# Patient Record
Sex: Male | Born: 1945
Health system: Southern US, Community
[De-identification: ages and names within clinical notes are randomized; demographics above are authoritative.]

## PROBLEM LIST (undated history)

## (undated) DIAGNOSIS — E039 Hypothyroidism, unspecified: Secondary | ICD-10-CM

## (undated) DIAGNOSIS — N400 Enlarged prostate without lower urinary tract symptoms: Secondary | ICD-10-CM

## (undated) DIAGNOSIS — R450 Nervousness: Secondary | ICD-10-CM

## (undated) DIAGNOSIS — I493 Ventricular premature depolarization: Secondary | ICD-10-CM

## (undated) DIAGNOSIS — I10 Essential (primary) hypertension: Secondary | ICD-10-CM

## (undated) DIAGNOSIS — E559 Vitamin D deficiency, unspecified: Secondary | ICD-10-CM

## (undated) DIAGNOSIS — R002 Palpitations: Secondary | ICD-10-CM

## (undated) DIAGNOSIS — E291 Testicular hypofunction: Secondary | ICD-10-CM

## (undated) DIAGNOSIS — M5093 Cervical disc disorder, unspecified, cervicothoracic region: Secondary | ICD-10-CM

## (undated) DIAGNOSIS — G4700 Insomnia, unspecified: Secondary | ICD-10-CM

## (undated) DIAGNOSIS — R0789 Other chest pain: Secondary | ICD-10-CM

## (undated) DIAGNOSIS — U071 COVID-19: Secondary | ICD-10-CM

## (undated) DIAGNOSIS — E785 Hyperlipidemia, unspecified: Secondary | ICD-10-CM

## (undated) DIAGNOSIS — M810 Age-related osteoporosis without current pathological fracture: Secondary | ICD-10-CM

## (undated) DIAGNOSIS — R42 Dizziness and giddiness: Secondary | ICD-10-CM

## (undated) DIAGNOSIS — E7800 Pure hypercholesterolemia, unspecified: Secondary | ICD-10-CM

## (undated) DIAGNOSIS — I2699 Other pulmonary embolism without acute cor pulmonale: Secondary | ICD-10-CM

## (undated) DIAGNOSIS — R9431 Abnormal electrocardiogram [ECG] [EKG]: Secondary | ICD-10-CM

## (undated) DIAGNOSIS — F32A Depression, unspecified: Secondary | ICD-10-CM

## (undated) DIAGNOSIS — Z6827 Body mass index (BMI) 27.0-27.9, adult: Secondary | ICD-10-CM

## (undated) HISTORY — DX: Testicular hypofunction: E29.1

## (undated) HISTORY — DX: Cervical disc disorder, unspecified, cervicothoracic region: M50.93

## (undated) HISTORY — DX: Vitamin D deficiency, unspecified: E55.9

## (undated) HISTORY — DX: Ventricular premature depolarization: I49.3

## (undated) HISTORY — PX: MASTOIDECTOMY: SHX711

## (undated) HISTORY — DX: Essential (primary) hypertension: I10

## (undated) HISTORY — PX: TONSILECTOMY, ADENOIDECTOMY, BILATERAL MYRINGOTOMY AND TUBES: SHX2538

## (undated) HISTORY — DX: Dizziness and giddiness: R42

## (undated) HISTORY — DX: COVID-19: U07.1

## (undated) HISTORY — DX: Insomnia, unspecified: G47.00

## (undated) HISTORY — DX: Hyperlipidemia, unspecified: E78.5

## (undated) HISTORY — DX: Age-related osteoporosis without current pathological fracture: M81.0

## (undated) HISTORY — DX: Palpitations: R00.2

## (undated) HISTORY — DX: Other chest pain: R07.89

## (undated) HISTORY — DX: Nervousness: R45.0

## (undated) HISTORY — DX: Body mass index (BMI) 27.0-27.9, adult: Z68.27

## (undated) HISTORY — DX: Depression, unspecified: F32.A

## (undated) HISTORY — DX: Abnormal electrocardiogram (ECG) (EKG): R94.31

## (undated) HISTORY — DX: Other pulmonary embolism without acute cor pulmonale: I26.99

## (undated) HISTORY — PX: NASAL SEPTUM SURGERY: SHX37

---

## 2015-09-27 DIAGNOSIS — R5383 Other fatigue: Secondary | ICD-10-CM | POA: Diagnosis not present

## 2015-09-27 DIAGNOSIS — Z125 Encounter for screening for malignant neoplasm of prostate: Secondary | ICD-10-CM | POA: Diagnosis not present

## 2015-09-27 DIAGNOSIS — M949 Disorder of cartilage, unspecified: Secondary | ICD-10-CM | POA: Diagnosis not present

## 2015-10-04 ENCOUNTER — Other Ambulatory Visit (HOSPITAL_COMMUNITY): Payer: Self-pay | Admitting: Family Medicine

## 2015-10-04 DIAGNOSIS — C61 Malignant neoplasm of prostate: Secondary | ICD-10-CM

## 2015-10-20 ENCOUNTER — Ambulatory Visit (HOSPITAL_COMMUNITY): Payer: Self-pay

## 2015-10-24 DIAGNOSIS — E291 Testicular hypofunction: Secondary | ICD-10-CM | POA: Diagnosis not present

## 2015-10-24 DIAGNOSIS — Z125 Encounter for screening for malignant neoplasm of prostate: Secondary | ICD-10-CM | POA: Diagnosis not present

## 2015-10-24 DIAGNOSIS — R5381 Other malaise: Secondary | ICD-10-CM | POA: Diagnosis not present

## 2015-11-01 ENCOUNTER — Ambulatory Visit (HOSPITAL_COMMUNITY)
Admission: RE | Admit: 2015-11-01 | Discharge: 2015-11-01 | Disposition: A | Discharge status: Home / Self Care | Payer: PPO | Source: Ambulatory Visit | Attending: Family Medicine | Admitting: Family Medicine

## 2015-11-01 DIAGNOSIS — R972 Elevated prostate specific antigen [PSA]: Secondary | ICD-10-CM | POA: Diagnosis not present

## 2015-11-01 DIAGNOSIS — C61 Malignant neoplasm of prostate: Secondary | ICD-10-CM | POA: Diagnosis not present

## 2015-11-01 DIAGNOSIS — R590 Localized enlarged lymph nodes: Secondary | ICD-10-CM | POA: Diagnosis not present

## 2015-11-01 MED ORDER — GADOBENATE DIMEGLUMINE 529 MG/ML IV SOLN
17.0000 mL | Freq: Once | INTRAVENOUS | Status: AC | PRN
  Administered 2015-11-01: 17 mL via INTRAVENOUS

## 2016-05-09 DIAGNOSIS — H2513 Age-related nuclear cataract, bilateral: Secondary | ICD-10-CM | POA: Diagnosis not present

## 2016-05-09 DIAGNOSIS — H04123 Dry eye syndrome of bilateral lacrimal glands: Secondary | ICD-10-CM | POA: Diagnosis not present

## 2016-07-29 DIAGNOSIS — Z125 Encounter for screening for malignant neoplasm of prostate: Secondary | ICD-10-CM | POA: Diagnosis not present

## 2016-07-29 DIAGNOSIS — R5381 Other malaise: Secondary | ICD-10-CM | POA: Diagnosis not present

## 2016-09-16 DIAGNOSIS — H7312 Chronic myringitis, left ear: Secondary | ICD-10-CM | POA: Diagnosis not present

## 2016-09-16 DIAGNOSIS — H6982 Other specified disorders of Eustachian tube, left ear: Secondary | ICD-10-CM | POA: Diagnosis not present

## 2016-09-16 DIAGNOSIS — H903 Sensorineural hearing loss, bilateral: Secondary | ICD-10-CM | POA: Diagnosis not present

## 2016-10-02 DIAGNOSIS — H25813 Combined forms of age-related cataract, bilateral: Secondary | ICD-10-CM | POA: Diagnosis not present

## 2016-10-09 DIAGNOSIS — Z125 Encounter for screening for malignant neoplasm of prostate: Secondary | ICD-10-CM | POA: Diagnosis not present

## 2016-10-09 DIAGNOSIS — E559 Vitamin D deficiency, unspecified: Secondary | ICD-10-CM | POA: Diagnosis not present

## 2016-10-09 DIAGNOSIS — R5381 Other malaise: Secondary | ICD-10-CM | POA: Diagnosis not present

## 2016-10-10 DIAGNOSIS — H25811 Combined forms of age-related cataract, right eye: Secondary | ICD-10-CM | POA: Diagnosis not present

## 2016-10-14 DIAGNOSIS — H2511 Age-related nuclear cataract, right eye: Secondary | ICD-10-CM | POA: Diagnosis not present

## 2016-10-14 DIAGNOSIS — H25811 Combined forms of age-related cataract, right eye: Secondary | ICD-10-CM | POA: Diagnosis not present

## 2016-10-21 DIAGNOSIS — H25812 Combined forms of age-related cataract, left eye: Secondary | ICD-10-CM | POA: Diagnosis not present

## 2016-10-21 DIAGNOSIS — H2512 Age-related nuclear cataract, left eye: Secondary | ICD-10-CM | POA: Diagnosis not present

## 2016-11-01 DIAGNOSIS — R5381 Other malaise: Secondary | ICD-10-CM | POA: Diagnosis not present

## 2016-11-01 DIAGNOSIS — R899 Unspecified abnormal finding in specimens from other organs, systems and tissues: Secondary | ICD-10-CM | POA: Diagnosis not present

## 2016-11-01 DIAGNOSIS — Z125 Encounter for screening for malignant neoplasm of prostate: Secondary | ICD-10-CM | POA: Diagnosis not present

## 2017-03-19 DIAGNOSIS — R5381 Other malaise: Secondary | ICD-10-CM | POA: Diagnosis not present

## 2017-03-19 DIAGNOSIS — Z125 Encounter for screening for malignant neoplasm of prostate: Secondary | ICD-10-CM | POA: Diagnosis not present

## 2017-05-09 DIAGNOSIS — W19XXXA Unspecified fall, initial encounter: Secondary | ICD-10-CM | POA: Diagnosis not present

## 2017-05-09 DIAGNOSIS — R279 Unspecified lack of coordination: Secondary | ICD-10-CM | POA: Diagnosis not present

## 2017-05-09 DIAGNOSIS — R42 Dizziness and giddiness: Secondary | ICD-10-CM | POA: Diagnosis not present

## 2017-05-09 DIAGNOSIS — R5381 Other malaise: Secondary | ICD-10-CM | POA: Diagnosis not present

## 2017-05-09 DIAGNOSIS — I10 Essential (primary) hypertension: Secondary | ICD-10-CM | POA: Diagnosis not present

## 2017-05-13 DIAGNOSIS — H26493 Other secondary cataract, bilateral: Secondary | ICD-10-CM | POA: Diagnosis not present

## 2017-06-05 DIAGNOSIS — H6982 Other specified disorders of Eustachian tube, left ear: Secondary | ICD-10-CM | POA: Diagnosis not present

## 2017-06-05 DIAGNOSIS — H903 Sensorineural hearing loss, bilateral: Secondary | ICD-10-CM | POA: Diagnosis not present

## 2017-08-11 DIAGNOSIS — Z961 Presence of intraocular lens: Secondary | ICD-10-CM | POA: Diagnosis not present

## 2017-11-06 DIAGNOSIS — Z125 Encounter for screening for malignant neoplasm of prostate: Secondary | ICD-10-CM | POA: Diagnosis not present

## 2017-11-06 DIAGNOSIS — R5381 Other malaise: Secondary | ICD-10-CM | POA: Diagnosis not present

## 2017-11-06 DIAGNOSIS — E785 Hyperlipidemia, unspecified: Secondary | ICD-10-CM | POA: Diagnosis not present

## 2017-11-06 DIAGNOSIS — E559 Vitamin D deficiency, unspecified: Secondary | ICD-10-CM | POA: Diagnosis not present

## 2017-12-24 DIAGNOSIS — H903 Sensorineural hearing loss, bilateral: Secondary | ICD-10-CM | POA: Diagnosis not present

## 2017-12-24 DIAGNOSIS — H6982 Other specified disorders of Eustachian tube, left ear: Secondary | ICD-10-CM | POA: Diagnosis not present

## 2018-01-02 DIAGNOSIS — Z23 Encounter for immunization: Secondary | ICD-10-CM | POA: Diagnosis not present

## 2018-03-05 DIAGNOSIS — H43391 Other vitreous opacities, right eye: Secondary | ICD-10-CM | POA: Diagnosis not present

## 2018-03-05 DIAGNOSIS — H43811 Vitreous degeneration, right eye: Secondary | ICD-10-CM | POA: Diagnosis not present

## 2018-03-23 DIAGNOSIS — Z125 Encounter for screening for malignant neoplasm of prostate: Secondary | ICD-10-CM | POA: Diagnosis not present

## 2018-03-23 DIAGNOSIS — R5381 Other malaise: Secondary | ICD-10-CM | POA: Diagnosis not present

## 2018-03-23 DIAGNOSIS — M109 Gout, unspecified: Secondary | ICD-10-CM | POA: Diagnosis not present

## 2018-09-15 DIAGNOSIS — R5381 Other malaise: Secondary | ICD-10-CM | POA: Diagnosis not present

## 2018-09-15 DIAGNOSIS — E559 Vitamin D deficiency, unspecified: Secondary | ICD-10-CM | POA: Diagnosis not present

## 2018-09-15 DIAGNOSIS — Z125 Encounter for screening for malignant neoplasm of prostate: Secondary | ICD-10-CM | POA: Diagnosis not present

## 2018-11-25 DIAGNOSIS — Z125 Encounter for screening for malignant neoplasm of prostate: Secondary | ICD-10-CM | POA: Diagnosis not present

## 2018-11-25 DIAGNOSIS — R972 Elevated prostate specific antigen [PSA]: Secondary | ICD-10-CM | POA: Diagnosis not present

## 2018-11-25 DIAGNOSIS — R5381 Other malaise: Secondary | ICD-10-CM | POA: Diagnosis not present

## 2018-12-17 DIAGNOSIS — R972 Elevated prostate specific antigen [PSA]: Secondary | ICD-10-CM | POA: Diagnosis not present

## 2019-03-02 DIAGNOSIS — Z8669 Personal history of other diseases of the nervous system and sense organs: Secondary | ICD-10-CM | POA: Diagnosis not present

## 2019-03-02 DIAGNOSIS — Z9622 Myringotomy tube(s) status: Secondary | ICD-10-CM | POA: Diagnosis not present

## 2019-03-02 DIAGNOSIS — Z9889 Other specified postprocedural states: Secondary | ICD-10-CM | POA: Diagnosis not present

## 2019-03-02 DIAGNOSIS — H9192 Unspecified hearing loss, left ear: Secondary | ICD-10-CM | POA: Diagnosis not present

## 2019-03-10 DIAGNOSIS — I10 Essential (primary) hypertension: Secondary | ICD-10-CM | POA: Diagnosis not present

## 2019-03-10 DIAGNOSIS — R079 Chest pain, unspecified: Secondary | ICD-10-CM | POA: Diagnosis not present

## 2019-03-10 DIAGNOSIS — R5381 Other malaise: Secondary | ICD-10-CM | POA: Diagnosis not present

## 2019-03-10 DIAGNOSIS — Z1339 Encounter for screening examination for other mental health and behavioral disorders: Secondary | ICD-10-CM | POA: Diagnosis not present

## 2019-03-10 DIAGNOSIS — E785 Hyperlipidemia, unspecified: Secondary | ICD-10-CM | POA: Diagnosis not present

## 2019-03-10 DIAGNOSIS — Z008 Encounter for other general examination: Secondary | ICD-10-CM | POA: Diagnosis not present

## 2019-03-10 DIAGNOSIS — Z125 Encounter for screening for malignant neoplasm of prostate: Secondary | ICD-10-CM | POA: Diagnosis not present

## 2019-03-10 DIAGNOSIS — Z1331 Encounter for screening for depression: Secondary | ICD-10-CM | POA: Diagnosis not present

## 2019-03-10 DIAGNOSIS — E559 Vitamin D deficiency, unspecified: Secondary | ICD-10-CM | POA: Diagnosis not present

## 2019-06-07 DIAGNOSIS — E785 Hyperlipidemia, unspecified: Secondary | ICD-10-CM | POA: Diagnosis not present

## 2019-06-07 DIAGNOSIS — R5381 Other malaise: Secondary | ICD-10-CM | POA: Diagnosis not present

## 2019-06-07 DIAGNOSIS — I10 Essential (primary) hypertension: Secondary | ICD-10-CM | POA: Diagnosis not present

## 2019-08-06 DIAGNOSIS — J029 Acute pharyngitis, unspecified: Secondary | ICD-10-CM | POA: Diagnosis not present

## 2019-08-06 DIAGNOSIS — R519 Headache, unspecified: Secondary | ICD-10-CM | POA: Diagnosis not present

## 2019-08-06 DIAGNOSIS — Z20828 Contact with and (suspected) exposure to other viral communicable diseases: Secondary | ICD-10-CM | POA: Diagnosis not present

## 2019-08-11 DIAGNOSIS — N390 Urinary tract infection, site not specified: Secondary | ICD-10-CM | POA: Diagnosis not present

## 2019-08-11 DIAGNOSIS — R972 Elevated prostate specific antigen [PSA]: Secondary | ICD-10-CM | POA: Diagnosis not present

## 2019-08-11 DIAGNOSIS — E559 Vitamin D deficiency, unspecified: Secondary | ICD-10-CM | POA: Diagnosis not present

## 2019-08-11 DIAGNOSIS — R5381 Other malaise: Secondary | ICD-10-CM | POA: Diagnosis not present

## 2019-08-11 DIAGNOSIS — M2540 Effusion, unspecified joint: Secondary | ICD-10-CM | POA: Diagnosis not present

## 2019-08-11 DIAGNOSIS — E291 Testicular hypofunction: Secondary | ICD-10-CM | POA: Diagnosis not present

## 2019-08-11 DIAGNOSIS — M2550 Pain in unspecified joint: Secondary | ICD-10-CM | POA: Diagnosis not present

## 2019-08-11 DIAGNOSIS — Z20828 Contact with and (suspected) exposure to other viral communicable diseases: Secondary | ICD-10-CM | POA: Diagnosis not present

## 2019-08-11 DIAGNOSIS — M109 Gout, unspecified: Secondary | ICD-10-CM | POA: Diagnosis not present

## 2019-08-12 DIAGNOSIS — M2550 Pain in unspecified joint: Secondary | ICD-10-CM | POA: Diagnosis not present

## 2019-08-12 DIAGNOSIS — M2540 Effusion, unspecified joint: Secondary | ICD-10-CM | POA: Diagnosis not present

## 2019-08-12 DIAGNOSIS — R5381 Other malaise: Secondary | ICD-10-CM | POA: Diagnosis not present

## 2019-08-12 DIAGNOSIS — Z20828 Contact with and (suspected) exposure to other viral communicable diseases: Secondary | ICD-10-CM | POA: Diagnosis not present

## 2019-08-13 DIAGNOSIS — R509 Fever, unspecified: Secondary | ICD-10-CM | POA: Diagnosis not present

## 2019-08-15 ENCOUNTER — Emergency Department (HOSPITAL_COMMUNITY)
Admission: EM | Admit: 2019-08-15 | Discharge: 2019-08-15 | Disposition: A | Discharge status: Home / Self Care | Payer: PPO | Source: Home / Self Care | Attending: Emergency Medicine | Admitting: Emergency Medicine

## 2019-08-15 ENCOUNTER — Emergency Department (HOSPITAL_COMMUNITY): Payer: PPO

## 2019-08-15 ENCOUNTER — Encounter (HOSPITAL_COMMUNITY): Payer: Self-pay

## 2019-08-15 ENCOUNTER — Other Ambulatory Visit: Payer: Self-pay

## 2019-08-15 DIAGNOSIS — E039 Hypothyroidism, unspecified: Secondary | ICD-10-CM | POA: Insufficient documentation

## 2019-08-15 DIAGNOSIS — I10 Essential (primary) hypertension: Secondary | ICD-10-CM | POA: Insufficient documentation

## 2019-08-15 DIAGNOSIS — R05 Cough: Secondary | ICD-10-CM | POA: Insufficient documentation

## 2019-08-15 DIAGNOSIS — U071 COVID-19: Secondary | ICD-10-CM | POA: Insufficient documentation

## 2019-08-15 DIAGNOSIS — R079 Chest pain, unspecified: Secondary | ICD-10-CM | POA: Diagnosis not present

## 2019-08-15 DIAGNOSIS — E876 Hypokalemia: Secondary | ICD-10-CM

## 2019-08-15 HISTORY — DX: Pure hypercholesterolemia, unspecified: E78.00

## 2019-08-15 HISTORY — DX: Hypothyroidism, unspecified: E03.9

## 2019-08-15 HISTORY — DX: Essential (primary) hypertension: I10

## 2019-08-15 LAB — CBC WITH DIFFERENTIAL/PLATELET
Abs Immature Granulocytes: 0.05 10*3/uL (ref 0.00–0.07)
Basophils Absolute: 0 10*3/uL (ref 0.0–0.1)
Basophils Relative: 0 %
Eosinophils Absolute: 0 10*3/uL (ref 0.0–0.5)
Eosinophils Relative: 0 %
HCT: 36.5 % — ABNORMAL LOW (ref 39.0–52.0)
Hemoglobin: 12.3 g/dL — ABNORMAL LOW (ref 13.0–17.0)
Immature Granulocytes: 1 %
Lymphocytes Relative: 7 %
Lymphs Abs: 0.7 10*3/uL (ref 0.7–4.0)
MCH: 30.1 pg (ref 26.0–34.0)
MCHC: 33.7 g/dL (ref 30.0–36.0)
MCV: 89.5 fL (ref 80.0–100.0)
Monocytes Absolute: 0.4 10*3/uL (ref 0.1–1.0)
Monocytes Relative: 4 %
Neutro Abs: 8.7 10*3/uL — ABNORMAL HIGH (ref 1.7–7.7)
Neutrophils Relative %: 88 %
Platelets: 256 10*3/uL (ref 150–400)
RBC: 4.08 MIL/uL — ABNORMAL LOW (ref 4.22–5.81)
RDW: 14.2 % (ref 11.5–15.5)
WBC: 9.9 10*3/uL (ref 4.0–10.5)
nRBC: 0 % (ref 0.0–0.2)

## 2019-08-15 LAB — COMPREHENSIVE METABOLIC PANEL
ALT: 43 U/L (ref 0–44)
AST: 38 U/L (ref 15–41)
Albumin: 3 g/dL — ABNORMAL LOW (ref 3.5–5.0)
Alkaline Phosphatase: 45 U/L (ref 38–126)
Anion gap: 11 (ref 5–15)
BUN: 20 mg/dL (ref 8–23)
CO2: 21 mmol/L — ABNORMAL LOW (ref 22–32)
Calcium: 8 mg/dL — ABNORMAL LOW (ref 8.9–10.3)
Chloride: 105 mmol/L (ref 98–111)
Creatinine, Ser: 0.8 mg/dL (ref 0.61–1.24)
GFR calc Af Amer: >60 mL/min (ref >60)
GFR calc non Af Amer: >60 mL/min (ref >60)
Glucose, Bld: 101 mg/dL — ABNORMAL HIGH (ref 70–99)
Potassium: 3.3 mmol/L — ABNORMAL LOW (ref 3.5–5.1)
Sodium: 137 mmol/L (ref 135–145)
Total Bilirubin: 0.2 mg/dL — ABNORMAL LOW (ref 0.3–1.2)
Total Protein: 6.5 g/dL (ref 6.5–8.1)

## 2019-08-15 LAB — LACTIC ACID, PLASMA: Lactic Acid, Venous: 1.6 mmol/L (ref 0.5–1.9)

## 2019-08-15 MED ORDER — ACETAMINOPHEN 325 MG PO TABS
325.0000 mg | ORAL_TABLET | Freq: Once | ORAL | Status: AC
  Administered 2019-08-15: 325 mg via ORAL
  Filled 2019-08-15: qty 1

## 2019-08-15 MED ORDER — IBUPROFEN 400 MG PO TABS: 400.0000 mg | ORAL_TABLET | Freq: Once | ORAL | Status: DC

## 2019-08-15 MED ORDER — POTASSIUM CHLORIDE CRYS ER 20 MEQ PO TBCR
40.0000 meq | EXTENDED_RELEASE_TABLET | Freq: Once | ORAL | Status: AC
  Administered 2019-08-15: 40 meq via ORAL
  Filled 2019-08-15: qty 2

## 2019-08-15 MED ORDER — GUAIFENESIN-CODEINE 100-10 MG/5ML PO SOLN
5.0000 mL | Freq: Once | ORAL | Status: AC
  Administered 2019-08-15: 5 mL via ORAL
  Filled 2019-08-15: qty 5

## 2019-08-15 MED ORDER — SODIUM CHLORIDE 0.9 % IV BOLUS
1000.0000 mL | Freq: Once | INTRAVENOUS | Status: AC
  Administered 2019-08-15: 1000 mL via INTRAVENOUS

## 2019-08-15 MED ORDER — POTASSIUM CHLORIDE CRYS ER 20 MEQ PO TBCR: 20.0000 meq | EXTENDED_RELEASE_TABLET | Freq: Every day | ORAL | 0 refills | Status: DC

## 2019-08-15 MED ORDER — IOHEXOL 350 MG/ML SOLN
100.0000 mL | Freq: Once | INTRAVENOUS | Status: AC | PRN
  Administered 2019-08-15: 100 mL via INTRAVENOUS

## 2019-08-15 NOTE — Discharge Instructions (Signed)
You were seen in the emergency department today for symptoms related to COVID-19. Your work-up in the emergency department was overall reassuring. Your labs show that your calcium and potassium are each mildly low as listed below. We have provided diet recommendations. We are sending you home with a short course of a potassium supplement to help with this. Your hemoglobin seems to have been fairly similar to prior, slightly lower. Please have each of these labs rechecked within 1 week.  Your CT scan showed findings consistent with COVID-19 as detailed below. There was no blood clot.  Please continue to take your doxycycline and use your inhaler as well as cough medicines as needed.  Please Follow-up with your primary care provider for evaluation of your symptoms within 3 days. Please continue to quarantine per CDC guidelines. Return to the emergency department for any new or worsening symptoms including but not limited to shortness of breath, chest pain, dizziness, passing out, coughing up blood, or any other concerns.   Results for orders placed or performed during the hospital encounter of 08/15/19  Comprehensive metabolic panel  Result Value Ref Range   Sodium 137 135 - 145 mmol/L   Potassium 3.3 (L) 3.5 - 5.1 mmol/L   Chloride 105 98 - 111 mmol/L   CO2 21 (L) 22 - 32 mmol/L   Glucose, Bld 101 (H) 70 - 99 mg/dL   BUN 20 8 - 23 mg/dL   Creatinine, Ser 0.80 0.61 - 1.24 mg/dL   Calcium 8.0 (L) 8.9 - 10.3 mg/dL   Total Protein 6.5 6.5 - 8.1 g/dL   Albumin 3.0 (L) 3.5 - 5.0 g/dL   AST 38 15 - 41 U/L   ALT 43 0 - 44 U/L   Alkaline Phosphatase 45 38 - 126 U/L   Total Bilirubin 0.2 (L) 0.3 - 1.2 mg/dL   GFR calc non Af Amer >60 >60 mL/min   GFR calc Af Amer >60 >60 mL/min   Anion gap 11 5 - 15  CBC with Differential  Result Value Ref Range   WBC 9.9 4.0 - 10.5 K/uL   RBC 4.08 (L) 4.22 - 5.81 MIL/uL   Hemoglobin 12.3 (L) 13.0 - 17.0 g/dL   HCT 36.5 (L) 39.0 - 52.0 %   MCV 89.5 80.0 -  100.0 fL   MCH 30.1 26.0 - 34.0 pg   MCHC 33.7 30.0 - 36.0 g/dL   RDW 14.2 11.5 - 15.5 %   Platelets 256 150 - 400 K/uL   nRBC 0.0 0.0 - 0.2 %   Neutrophils Relative % 88 %   Neutro Abs 8.7 (H) 1.7 - 7.7 K/uL   Lymphocytes Relative 7 %   Lymphs Abs 0.7 0.7 - 4.0 K/uL   Monocytes Relative 4 %   Monocytes Absolute 0.4 0.1 - 1.0 K/uL   Eosinophils Relative 0 %   Eosinophils Absolute 0.0 0.0 - 0.5 K/uL   Basophils Relative 0 %   Basophils Absolute 0.0 0.0 - 0.1 K/uL   Immature Granulocytes 1 %   Abs Immature Granulocytes 0.05 0.00 - 0.07 K/uL  Lactic acid, plasma  Result Value Ref Range   Lactic Acid, Venous 1.6 0.5 - 1.9 mmol/L   Ct Angio Chest Pe W/cm &/or Wo Cm  Result Date: 08/15/2019 CLINICAL DATA:  Intermittent fever, dry cough for 2 weeks, tested COVID-19 positive this past Wednesday, new onset cough, history hypertension EXAM: CT ANGIOGRAPHY CHEST WITH CONTRAST TECHNIQUE: Multidetector CT imaging of the chest was performed using the  standard protocol during bolus administration of intravenous contrast. Multiplanar CT image reconstructions and MIPs were obtained to evaluate the vascular anatomy. CONTRAST:  139mL OMNIPAQUE IOHEXOL 350 MG/ML SOLN IV COMPARISON:  None FINDINGS: Cardiovascular: Atherosclerotic calcifications aorta, coronary arteries and proximal great vessels. Aorta normal caliber. Upper normal size of cardiac chambers. No pericardial effusion. Pulmonary arteries well opacified and patent. No evidence of pulmonary embolism. Mediastinum/Nodes: Base of cervical region normal appearance. No thoracic adenopathy. Esophagus unremarkable. Lungs/Pleura: Extensive BILATERAL patchy ground-glass and airspace infiltrates consistent with multifocal pneumonia and history of COVID-19. No pleural effusion or pneumothorax. No discrete mass/nodule identified. Upper Abdomen: Visualized upper abdomen normal appearance Musculoskeletal: Osseous structures unremarkable. Review of the MIP images  confirms the above findings. IMPRESSION: No evidence of pulmonary embolism. Extensive BILATERAL patchy ground-glass and airspace infiltrates consistent with multifocal pneumonia due to COVID-19. Aortic Atherosclerosis (ICD10-I70.0). Electronically Signed   By: Lavonia Dana M.D.   On: 08/15/2019 21:11       Person Under Monitoring Name: MCCALL BEAUCHESNE  Location: [place] [address] [id number]   Infection Prevention Recommendations for Individuals Confirmed to have, or Being Evaluated for, 2019 Novel Coronavirus (COVID-19) Infection Who Receive Care at Home  Individuals who are confirmed to have, or are being evaluated for, COVID-19 should follow the prevention steps below until a healthcare provider or local or state health department says they can return to normal activities.  Stay home except to get medical care You should restrict activities outside your home, except for getting medical care. Do not go to work, school, or public areas, and do not use public transportation or taxis.  Call ahead before visiting your doctor Before your medical appointment, call the healthcare provider and tell them that you have, or are being evaluated for, COVID-19 infection. This will help the healthcare providers office take steps to keep other people from getting infected. Ask your healthcare provider to call the local or state health department.  Monitor your symptoms Seek prompt medical attention if your illness is worsening (e.g., difficulty breathing). Before going to your medical appointment, call the healthcare provider and tell them that you have, or are being evaluated for, COVID-19 infection. Ask your healthcare provider to call the local or state health department.  Wear a facemask You should wear a facemask that covers your nose and mouth when you are in the same room with other people and when you visit a healthcare provider. People who live with or visit you should also  wear a facemask while they are in the same room with you.  Separate yourself from other people in your home As much as possible, you should stay in a different room from other people in your home. Also, you should use a separate bathroom, if available.  Avoid sharing household items You should not share dishes, drinking glasses, cups, eating utensils, towels, bedding, or other items with other people in your home. After using these items, you should wash them thoroughly with soap and water.  Cover your coughs and sneezes Cover your mouth and nose with a tissue when you cough or sneeze, or you can cough or sneeze into your sleeve. Throw used tissues in a lined trash can, and immediately wash your hands with soap and water for at least 20 seconds or use an alcohol-based hand rub.  Wash your [facility] your hands often and thoroughly with soap and water for at least 20 seconds. You can use an alcohol-based hand sanitizer if soap  and water are not available and if your hands are not visibly dirty. Avoid touching your eyes, nose, and mouth with unwashed hands.   Prevention Steps for Caregivers and Household Members of Individuals Confirmed to have, or Being Evaluated for, COVID-19 Infection Being Cared for in the Home  If you live with, or provide care at home for, a person confirmed to have, or being evaluated for, COVID-19 infection please follow these guidelines to prevent infection:  Follow healthcare providers instructions Make sure that you understand and can help the patient follow any healthcare provider instructions for all care.  Provide for the patients basic needs You should help the patient with basic needs in the home and provide support for getting groceries, prescriptions, and other personal needs.  Monitor the patients symptoms If they are getting sicker, call his or her medical provider and tell them that the patient has, or is being evaluated for, COVID-19  infection. This will help the healthcare providers office take steps to keep other people from getting infected. Ask the healthcare provider to call the local or state health department.  Limit the number of people who have contact with the patient If possible, have only one caregiver for the patient. Other household members should stay in another home or place of residence. If this is not possible, they should stay in another room, or be separated from the patient as much as possible. Use a separate bathroom, if available. Restrict visitors who do not have an essential need to be in the home.  Keep older adults, very young children, and other sick people away from the patient Keep older adults, very young children, and those who have compromised immune systems or chronic health conditions away from the patient. This includes people with chronic heart, lung, or kidney conditions, diabetes, and cancer.  Ensure good ventilation Make sure that shared spaces in the home have good air flow, such as from an air conditioner or an opened window, weather permitting.  Wash your hands often Wash your hands often and thoroughly with soap and water for at least 20 seconds. You can use an alcohol based hand sanitizer if soap and water are not available and if your hands are not visibly dirty. Avoid touching your eyes, nose, and mouth with unwashed hands. Use disposable paper towels to dry your hands. If not available, use dedicated cloth towels and replace them when they become wet.  Wear a facemask and gloves Wear a disposable facemask at all times in the room and gloves when you touch or have contact with the patients blood, body fluids, and/or secretions or excretions, such as sweat, saliva, sputum, nasal mucus, vomit, urine, or feces.  Ensure the mask fits over your nose and mouth tightly, and do not touch it during use. Throw out disposable facemasks and gloves after using them. Do not reuse. Wash  your hands immediately after removing your facemask and gloves. If your personal clothing becomes contaminated, carefully remove clothing and launder. Wash your hands after handling contaminated clothing. Place all used disposable facemasks, gloves, and other waste in a lined container before disposing them with other household waste. Remove gloves and wash your hands immediately after handling these items.  Do not share dishes, glasses, or other household items with the patient Avoid sharing household items. You should not share dishes, drinking glasses, cups, eating utensils, towels, bedding, or other items with a patient who is confirmed to have, or being evaluated for, COVID-19 infection. After the person uses  these items, you should wash them thoroughly with soap and water.  Wash laundry thoroughly Immediately remove and wash clothes or bedding that have blood, body fluids, and/or secretions or excretions, such as sweat, saliva, sputum, nasal mucus, vomit, urine, or feces, on them. Wear gloves when handling laundry from the patient. Read and follow directions on labels of laundry or clothing items and detergent. In general, wash and dry with the warmest temperatures recommended on the label.  Clean all areas the individual has used often Clean all touchable surfaces, such as counters, tabletops, doorknobs, bathroom fixtures, toilets, phones, keyboards, tablets, and bedside tables, every day. Also, clean any surfaces that may have blood, body fluids, and/or secretions or excretions on them. Wear gloves when cleaning surfaces the patient has come in contact with. Use a diluted bleach solution (e.g., dilute bleach with 1 part bleach and 10 parts water) or a household disinfectant with a label that says EPA-registered for coronaviruses. To make a bleach solution at home, add 1 tablespoon of bleach to 1 quart (4 cups) of water. For a larger supply, add  cup of bleach to 1 gallon (16 cups) of  water. Read labels of cleaning products and follow recommendations provided on product labels. Labels contain instructions for safe and effective use of the cleaning product including precautions you should take when applying the product, such as wearing gloves or eye protection and making sure you have good ventilation during use of the product. Remove gloves and wash hands immediately after cleaning.  Monitor yourself for signs and symptoms of illness Caregivers and household members are considered close contacts, should monitor their health, and will be asked to limit movement outside of the home to the extent possible. Follow the monitoring steps for close contacts listed on the symptom monitoring form.   ? If you have additional questions, contact your local health department or call the epidemiologist on call at [phone] (available 24/7). ? This guidance is subject to change. For the most up-to-date guidance from [facility], please refer to their website: [email or web address]

## 2019-08-15 NOTE — ED Notes (Signed)
Patient contact number updated in the chart

## 2019-08-15 NOTE — ED Notes (Signed)
Patient transported to CT 

## 2019-08-15 NOTE — ED Provider Notes (Addendum)
[facility]PART[facility] Provider Note   CSN: [id number] Arrival date & time: 08/15/19  1531     History   Chief Complaint Chief Complaint  Patient presents with   Fever   Cough   HPI David Villegas is a 73 y.o. male with a history of hypertension, hypercholesterolemia, & hypothyroidism who presents to the ED with complaints of intermittent fever & cough since 08/05/19 with known COVID 19 positive testing performed 12/06. Patient states he has had a dry cough with intermittent fever with temp max of 102.5. He states with coughing he has some discomfort in the chest & to his incisional abdominal hernia but otherwise is not experiencing any pain. He notes his appetite decreases when fever is high but temp & appetite improve with anti-pyretics. No other alleviating/aggravating factors. COVID testing performed 12/06 returned positive on12/08- he had a CXR @ [facility] 12/08 that showed an atypical pneumonia. He just completed a course of Azithromycin yesterday which did not seem to change his symptoms much therefore he start doxycyline BID. He is also tried medicines, albuterol inhalers, and other over-the-counter medicines to help with his symptoms. 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 Denies ear pain, sore throat, congestion, dyspnea, nausea, vomiting, diarrhea, syncope, or leg pain/swelling.   HPI  Past Medical History:  Diagnosis Date   High cholesterol    Hypertension    Hypothyroidism     There are no active problems to display for this patient.   History reviewed. No pertinent surgical history.      Home Medications    Prior to Admission medications   Not on File    Family History History reviewed. No pertinent family history.  Social History Social History   Tobacco Use   Smoking status: Never Smoker     Smokeless tobacco: Never Used  Substance Use Topics   Alcohol use: Never    Frequency: Never   Drug use: Never     Allergies   Patient has no known allergies.  Review of Systems Review of Systems  Constitutional: Positive for appetite change and fever.  HENT: Negative for congestion, ear pain and sore throat.   Respiratory: Positive for cough. Negative for shortness of breath.   Cardiovascular: Positive for chest pain (w/ coughing otherwise none).  Gastrointestinal: Positive for abdominal pain (with coughing @ hernia site, otherwise none). Negative for diarrhea, nausea and vomiting.  Neurological: Negative for syncope.  All other systems reviewed and are negative.  Physical Exam Updated Vital Signs BP 93/78 (BP Location: Right Arm)    Pulse 71    Temp (!) 100.9 F (38.3 C) (Oral)    Resp 18    Ht 5\' 6"  (1.676 m)    Wt 74.8 kg    SpO2 96%    BMI 26.63 kg/m   Physical Exam Vitals signs and nursing note reviewed.  Constitutional:      General: He is not in acute distress.    Appearance: He is well-developed. He is not toxic-appearing.  HENT:     Head: Normocephalic and atraumatic.  Eyes:     General:        Right eye: No discharge.        Left eye: No discharge.     Conjunctiva/sclera: Conjunctivae normal.  Neck:     Musculoskeletal: Neck supple.  Cardiovascular:  Rate and Rhythm: Normal rate and regular rhythm.  Pulmonary:     Effort: Pulmonary effort is normal. No respiratory distress.     Breath sounds: Rhonchi (Right base.) present. No wheezing or rales.  Abdominal:     General: There is no distension.     Palpations: Abdomen is soft.     Tenderness: There is no abdominal tenderness. There is no guarding or rebound.     Hernia: A hernia (ventral defect, reducible) is present.  Musculoskeletal:     Right lower leg: No edema.     Left lower leg: No edema.  Skin:    General: Skin is warm and dry.     Findings: No rash.  Neurological:     Mental Status:  He is alert.     Comments: Clear speech.   Psychiatric:        Behavior: Behavior normal.    ED Treatments / Results  Labs (all labs ordered are listed, but only abnormal results are displayed) Labs Reviewed  COMPREHENSIVE METABOLIC PANEL - Abnormal; Notable for the following components:      Result Value   Potassium 3.3 (*)    CO2 21 (*)    Glucose, Bld 101 (*)    Calcium 8.0 (*)    Albumin 3.0 (*)    Total Bilirubin 0.2 (*)    All other components within normal limits  CBC WITH DIFFERENTIAL/PLATELET - Abnormal; Notable for the following components:   RBC 4.08 (*)    Hemoglobin 12.3 (*)    HCT 36.5 (*)    Neutro Abs 8.7 (*)    All other components within normal limits  CULTURE, BLOOD (ROUTINE X 2)  CULTURE, BLOOD (ROUTINE X 2)  LACTIC ACID, PLASMA  LACTIC ACID, PLASMA    EKG None  Radiology Ct Angio Chest Pe W/cm &/or Wo Cm  Result Date: 08/15/2019 CLINICAL DATA:  Intermittent fever, dry cough for 2 weeks, tested COVID-19 positive this past Wednesday, new onset cough, history hypertension EXAM: CT ANGIOGRAPHY CHEST WITH CONTRAST TECHNIQUE: Multidetector CT imaging of the chest was performed using the standard protocol during bolus administration of intravenous contrast. Multiplanar CT image reconstructions and MIPs were obtained to evaluate the vascular anatomy. CONTRAST:  123mL OMNIPAQUE IOHEXOL 350 MG/ML SOLN IV COMPARISON:  None FINDINGS: Cardiovascular: Atherosclerotic calcifications aorta, coronary arteries and proximal great vessels. Aorta normal caliber. Upper normal size of cardiac chambers. No pericardial effusion. Pulmonary arteries well opacified and patent. No evidence of pulmonary embolism. Mediastinum/Nodes: Base of cervical region normal appearance. No thoracic adenopathy. Esophagus unremarkable. Lungs/Pleura: Extensive BILATERAL patchy ground-glass and airspace infiltrates consistent with multifocal pneumonia and history of COVID-19. No pleural effusion or  pneumothorax. No discrete mass/nodule identified. Upper Abdomen: Visualized upper abdomen normal appearance Musculoskeletal: Osseous structures unremarkable. Review of the MIP images confirms the above findings. IMPRESSION: No evidence of pulmonary embolism. Extensive BILATERAL patchy ground-glass and airspace infiltrates consistent with multifocal pneumonia due to COVID-19. Aortic Atherosclerosis (ICD10-I70.0). Electronically Signed   By: Lavonia Dana M.D.   On: 08/15/2019 21:11    Procedures Procedures (including critical care time)  Medications Ordered in ED Medications  potassium chloride SA (KLOR-CON) CR tablet 40 mEq (has no administration in time range)  sodium chloride 0.9 % bolus 1,000 mL (0 mLs Intravenous Stopped 08/15/19 2024)  acetaminophen (TYLENOL) tablet 325 mg (325 mg Oral Given 08/15/19 1734)  iohexol (OMNIPAQUE) 350 MG/ML injection 100 mL (100 mLs Intravenous Contrast Given 08/15/19 2029)     Initial Impression /  Assessment and Plan / ED Course  I have reviewed the triage vital signs and the nursing notes.  Pertinent labs & imaging results that were available during my care of the patient were reviewed by me and considered in my medical decision making (see chart for details).   Patient with known COVID-19 positive testing presents to the emergency department for continued fevers and cough since 11/26. Patient is nontoxic-appearing, resting comfortably, vitals notable for fever with temperature of 100.9. Patient does have some rhonchi at the right base, lungs otherwise clear, no signs of respiratory distress. He does have a ventral hernia which is reducible. Plan for labs & CT chest. Will also give fluids.   CBC: Anemia which is similar to prior ranges per discussion of his prior labs with his wife via telephone. No leukocytosis or leukopenia. Platelets WNL. CMP: Mild hypokalemia and hypocalcemia, will provide K-Dur tablets and diet recommendations. Renal function preserved.  LFTs WNL. Lactic acid: WNL. EKG: No STEMI Chest CT: No pulmonary embolism, extensive bilateral patchy groundglass airspace infiltrates consistent with multifocal pneumonia due to COVID-19.  Ambulated throughout exam room maintaining SPO2 greater than 90%. NT/RN staff documented some tachypnea on vitals by automated system, on each of my assessments patient has had a normal respiratory rate- nursing staff informs me these have been during coughing spells.  He is not complaining of any significant shortness of breath. He overall appears appropriate for discharge home. Will provide potassium supplement. He is currently taking doxycycline which we will have him continue. Also already has an albuterol inhaler and antitussive medicines. He is comfortable with plan for discharge. I also spoke with his wife Dr. Toy Care via telephone. I discussed results, treatment plan, need for follow-up, and return precautions with the patient & his wife. Provided opportunity for questions, patient & his wife confirmed understanding and are in agreement with plan.   This is a shared visit with supervising physician Dr. Sedonia Small who has independently evaluated patient & provided guidance in evaluation/management/disposition, in agreement with care    Emerick S Herrman was evaluated in Emergency Department on 08/15/2019 for the symptoms described in the history of present illness. He/she was evaluated in the context of the global COVID-19 pandemic, which necessitated consideration that the patient might be at risk for infection with the SARS-CoV-2 virus that causes COVID-19. Institutional protocols and algorithms that pertain to the evaluation of patients at risk for COVID-19 are in a state of rapid change based on information released by regulatory bodies including the CDC and federal and state organizations. These policies and algorithms were followed during the patient's care in the ED.  Vitals:   08/15/19 1552 08/15/19 1814  BP:  93/78 120/71  Pulse: 71 72  Resp: 18 (!) 21  Temp: (!) 100.9 F (38.3 C) 98.1 F (36.7 C)  SpO2: 96% 95%    Final Clinical Impressions(s) / ED Diagnoses   Final diagnoses:  COVID-19  Hypokalemia  Hypocalcemia    ED Discharge Orders         Ordered    potassium chloride SA (KLOR-CON) 20 MEQ tablet  Daily     08/15/19 2206           Amaryllis Dyke, PA-C 08/15/19 2208    Amaryllis Dyke, PA-C 08/15/19 2254    Maudie Flakes, MD 08/19/19 1718

## 2019-08-15 NOTE — ED Triage Notes (Signed)
Pt presents with intermittent  fever and  Dry cough x2 weeks. Pt had a CXR at [facility] Friday afternoon. Tested for Covid Wednesday results came back Covid + on Friday. Pt here w/no new symptoms he is concerned he's not improving. Pt states he is taking OTC Tylenol, Mucinex and a Z pack w/no improvements.

## 2019-08-15 NOTE — ED Notes (Signed)
Pt ambulated inside the room. Pt stable on feet with steady gait. O2 92% on RA.

## 2019-08-17 ENCOUNTER — Telehealth: Payer: Self-pay | Admitting: Emergency Medicine

## 2019-08-17 ENCOUNTER — Other Ambulatory Visit: Payer: Self-pay

## 2019-08-17 ENCOUNTER — Emergency Department (HOSPITAL_COMMUNITY): Payer: PPO

## 2019-08-17 ENCOUNTER — Inpatient Hospital Stay (HOSPITAL_COMMUNITY)
Admission: EM | Admit: 2019-08-17 | Discharge: 2019-08-26 | DRG: 177 | Disposition: A | Discharge status: Home Health | Payer: PPO | Attending: Internal Medicine | Admitting: Internal Medicine

## 2019-08-17 ENCOUNTER — Encounter (HOSPITAL_COMMUNITY): Payer: Self-pay | Admitting: Emergency Medicine

## 2019-08-17 DIAGNOSIS — J9601 Acute respiratory failure with hypoxia: Secondary | ICD-10-CM | POA: Diagnosis not present

## 2019-08-17 DIAGNOSIS — U071 COVID-19: Secondary | ICD-10-CM | POA: Diagnosis not present

## 2019-08-17 DIAGNOSIS — R069 Unspecified abnormalities of breathing: Secondary | ICD-10-CM | POA: Diagnosis not present

## 2019-08-17 DIAGNOSIS — N400 Enlarged prostate without lower urinary tract symptoms: Secondary | ICD-10-CM | POA: Diagnosis not present

## 2019-08-17 DIAGNOSIS — Z8249 Family history of ischemic heart disease and other diseases of the circulatory system: Secondary | ICD-10-CM

## 2019-08-17 DIAGNOSIS — J1282 Pneumonia due to coronavirus disease 2019: Secondary | ICD-10-CM | POA: Diagnosis present

## 2019-08-17 DIAGNOSIS — M2550 Pain in unspecified joint: Secondary | ICD-10-CM | POA: Diagnosis not present

## 2019-08-17 DIAGNOSIS — J1289 Other viral pneumonia: Secondary | ICD-10-CM | POA: Diagnosis not present

## 2019-08-17 DIAGNOSIS — I10 Essential (primary) hypertension: Secondary | ICD-10-CM

## 2019-08-17 DIAGNOSIS — J189 Pneumonia, unspecified organism: Secondary | ICD-10-CM | POA: Diagnosis not present

## 2019-08-17 DIAGNOSIS — Z7401 Bed confinement status: Secondary | ICD-10-CM | POA: Diagnosis not present

## 2019-08-17 DIAGNOSIS — R0902 Hypoxemia: Secondary | ICD-10-CM | POA: Diagnosis not present

## 2019-08-17 DIAGNOSIS — M109 Gout, unspecified: Secondary | ICD-10-CM | POA: Diagnosis present

## 2019-08-17 DIAGNOSIS — E785 Hyperlipidemia, unspecified: Secondary | ICD-10-CM

## 2019-08-17 DIAGNOSIS — E039 Hypothyroidism, unspecified: Secondary | ICD-10-CM

## 2019-08-17 DIAGNOSIS — R0602 Shortness of breath: Secondary | ICD-10-CM | POA: Diagnosis not present

## 2019-08-17 DIAGNOSIS — E876 Hypokalemia: Secondary | ICD-10-CM | POA: Diagnosis not present

## 2019-08-17 DIAGNOSIS — R05 Cough: Secondary | ICD-10-CM | POA: Diagnosis not present

## 2019-08-17 DIAGNOSIS — E7800 Pure hypercholesterolemia, unspecified: Secondary | ICD-10-CM | POA: Diagnosis not present

## 2019-08-17 DIAGNOSIS — J440 Chronic obstructive pulmonary disease with acute lower respiratory infection: Secondary | ICD-10-CM | POA: Diagnosis present

## 2019-08-17 DIAGNOSIS — E8770 Fluid overload, unspecified: Secondary | ICD-10-CM | POA: Diagnosis not present

## 2019-08-17 DIAGNOSIS — K429 Umbilical hernia without obstruction or gangrene: Secondary | ICD-10-CM | POA: Diagnosis present

## 2019-08-17 DIAGNOSIS — R5381 Other malaise: Secondary | ICD-10-CM | POA: Diagnosis not present

## 2019-08-17 HISTORY — DX: Benign prostatic hyperplasia without lower urinary tract symptoms: N40.0

## 2019-08-17 HISTORY — DX: Hyperlipidemia, unspecified: E78.5

## 2019-08-17 HISTORY — DX: COVID-19: U07.1

## 2019-08-17 HISTORY — DX: Essential (primary) hypertension: I10

## 2019-08-17 LAB — CBC WITH DIFFERENTIAL/PLATELET
Abs Immature Granulocytes: 0.09 10*3/uL — ABNORMAL HIGH (ref 0.00–0.07)
Basophils Absolute: 0 10*3/uL (ref 0.0–0.1)
Basophils Relative: 0 %
Eosinophils Absolute: 0 10*3/uL (ref 0.0–0.5)
Eosinophils Relative: 0 %
HCT: 33.4 % — ABNORMAL LOW (ref 39.0–52.0)
Hemoglobin: 11.3 g/dL — ABNORMAL LOW (ref 13.0–17.0)
Immature Granulocytes: 1 %
Lymphocytes Relative: 5 %
Lymphs Abs: 0.6 10*3/uL — ABNORMAL LOW (ref 0.7–4.0)
MCH: 30.5 pg (ref 26.0–34.0)
MCHC: 33.8 g/dL (ref 30.0–36.0)
MCV: 90.3 fL (ref 80.0–100.0)
Monocytes Absolute: 0.2 10*3/uL (ref 0.1–1.0)
Monocytes Relative: 2 %
Neutro Abs: 11.6 10*3/uL — ABNORMAL HIGH (ref 1.7–7.7)
Neutrophils Relative %: 92 %
Platelets: 279 10*3/uL (ref 150–400)
RBC: 3.7 MIL/uL — ABNORMAL LOW (ref 4.22–5.81)
RDW: 14.6 % (ref 11.5–15.5)
WBC: 12.5 10*3/uL — ABNORMAL HIGH (ref 4.0–10.5)
nRBC: 0 % (ref 0.0–0.2)

## 2019-08-17 LAB — TROPONIN I (HIGH SENSITIVITY)
Troponin I (High Sensitivity): 13 ng/L (ref <18)
Troponin I (High Sensitivity): 17 ng/L (ref <18)

## 2019-08-17 LAB — COMPREHENSIVE METABOLIC PANEL
ALT: 30 U/L (ref 0–44)
AST: 31 U/L (ref 15–41)
Albumin: 2.5 g/dL — ABNORMAL LOW (ref 3.5–5.0)
Alkaline Phosphatase: 40 U/L (ref 38–126)
Anion gap: 9 (ref 5–15)
BUN: 18 mg/dL (ref 8–23)
CO2: 20 mmol/L — ABNORMAL LOW (ref 22–32)
Calcium: 8.1 mg/dL — ABNORMAL LOW (ref 8.9–10.3)
Chloride: 110 mmol/L (ref 98–111)
Creatinine, Ser: 0.72 mg/dL (ref 0.61–1.24)
GFR calc Af Amer: >60 mL/min (ref >60)
GFR calc non Af Amer: >60 mL/min (ref >60)
Glucose, Bld: 118 mg/dL — ABNORMAL HIGH (ref 70–99)
Potassium: 3.9 mmol/L (ref 3.5–5.1)
Sodium: 139 mmol/L (ref 135–145)
Total Bilirubin: 0.6 mg/dL (ref 0.3–1.2)
Total Protein: 6.3 g/dL — ABNORMAL LOW (ref 6.5–8.1)

## 2019-08-17 LAB — POCT I-STAT 7, (LYTES, BLD GAS, ICA,H+H)
Acid-base deficit: 4 mmol/L — ABNORMAL HIGH (ref 0.0–2.0)
Bicarbonate: 18.5 mmol/L — ABNORMAL LOW (ref 20.0–28.0)
Calcium, Ion: 1.18 mmol/L (ref 1.15–1.40)
HCT: 33 % — ABNORMAL LOW (ref 39.0–52.0)
Hemoglobin: 11.2 g/dL — ABNORMAL LOW (ref 13.0–17.0)
O2 Saturation: 85 %
Patient temperature: 37.9
Potassium: 3.6 mmol/L (ref 3.5–5.1)
Sodium: 139 mmol/L (ref 135–145)
TCO2: 19 mmol/L — ABNORMAL LOW (ref 22–32)
pCO2 arterial: 28.1 mmHg — ABNORMAL LOW (ref 32.0–48.0)
pH, Arterial: 7.432 (ref 7.350–7.450)
pO2, Arterial: 50 mmHg — ABNORMAL LOW (ref 83.0–108.0)

## 2019-08-17 LAB — LACTIC ACID, PLASMA
Lactic Acid, Venous: 2.1 mmol/L (ref 0.5–1.9)
Lactic Acid, Venous: 2.2 mmol/L (ref 0.5–1.9)

## 2019-08-17 LAB — FERRITIN: Ferritin: 251 ng/mL (ref 24–336)

## 2019-08-17 LAB — C-REACTIVE PROTEIN: CRP: 16.2 mg/dL — ABNORMAL HIGH (ref <1.0)

## 2019-08-17 LAB — PROCALCITONIN: Procalcitonin: <0.1 ng/mL

## 2019-08-17 LAB — BRAIN NATRIURETIC PEPTIDE: B Natriuretic Peptide: 128.8 pg/mL — ABNORMAL HIGH (ref 0.0–100.0)

## 2019-08-17 LAB — ABO/RH: ABO/RH(D): B POS

## 2019-08-17 LAB — D-DIMER, QUANTITATIVE: D-Dimer, Quant: 2.12 ug/mL-FEU — ABNORMAL HIGH (ref 0.00–0.50)

## 2019-08-17 MED ORDER — LEVOTHYROXINE SODIUM 75 MCG PO TABS: 150.0000 ug | ORAL_TABLET | Freq: Every day | ORAL | Status: DC

## 2019-08-17 MED ORDER — DEXAMETHASONE SODIUM PHOSPHATE 4 MG/ML IJ SOLN
2.0000 mg | Freq: Once | INTRAMUSCULAR | Status: AC
  Administered 2019-08-17: 2 mg via INTRAVENOUS
  Filled 2019-08-17: qty 1

## 2019-08-17 MED ORDER — SODIUM CHLORIDE 0.9 % IV SOLN
100.0000 mg | Freq: Every day | INTRAVENOUS | Status: AC
  Administered 2019-08-18 – 2019-08-21 (×4): 100 mg via INTRAVENOUS
  Filled 2019-08-17 (×4): qty 20

## 2019-08-17 MED ORDER — FAMOTIDINE IN NACL 20-0.9 MG/50ML-% IV SOLN
20.0000 mg | Freq: Two times a day (BID) | INTRAVENOUS | Status: DC
  Administered 2019-08-17 – 2019-08-20 (×6): 20 mg via INTRAVENOUS
  Filled 2019-08-17 (×6): qty 50

## 2019-08-17 MED ORDER — TERAZOSIN HCL 2 MG PO CAPS
2.0000 mg | ORAL_CAPSULE | Freq: Every day | ORAL | Status: DC
  Filled 2019-08-17: qty 1

## 2019-08-17 MED ORDER — SODIUM CHLORIDE 0.9 % IV SOLN
200.0000 mg | Freq: Once | INTRAVENOUS | Status: AC
  Administered 2019-08-17: 15:00:00 200 mg via INTRAVENOUS
  Filled 2019-08-17: qty 200

## 2019-08-17 MED ORDER — PROMETHAZINE-CODEINE 6.25-10 MG/5ML PO SYRP: 5.0000 mL | ORAL_SOLUTION | Freq: Four times a day (QID) | ORAL | Status: DC | PRN

## 2019-08-17 MED ORDER — HYDRALAZINE HCL 20 MG/ML IJ SOLN: 5.0000 mg | Freq: Four times a day (QID) | INTRAMUSCULAR | Status: DC | PRN

## 2019-08-17 MED ORDER — KETOROLAC TROMETHAMINE 15 MG/ML IJ SOLN
15.0000 mg | Freq: Four times a day (QID) | INTRAMUSCULAR | Status: DC | PRN
  Administered 2019-08-17 – 2019-08-19 (×3): 15 mg via INTRAVENOUS
  Filled 2019-08-17 (×4): qty 1

## 2019-08-17 MED ORDER — TAMSULOSIN HCL 0.4 MG PO CAPS
0.4000 mg | ORAL_CAPSULE | Freq: Every day | ORAL | Status: DC
  Administered 2019-08-17 – 2019-08-25 (×9): 0.4 mg via ORAL
  Filled 2019-08-17 (×10): qty 1

## 2019-08-17 MED ORDER — ROSUVASTATIN CALCIUM 5 MG PO TABS
10.0000 mg | ORAL_TABLET | Freq: Every day | ORAL | Status: DC
  Administered 2019-08-17 – 2019-08-25 (×7): 10 mg via ORAL
  Filled 2019-08-17 (×10): qty 2

## 2019-08-17 MED ORDER — ENOXAPARIN SODIUM 40 MG/0.4ML ~~LOC~~ SOLN
40.0000 mg | SUBCUTANEOUS | Status: DC
  Administered 2019-08-17 – 2019-08-19 (×3): 40 mg via SUBCUTANEOUS
  Filled 2019-08-17 (×3): qty 0.4

## 2019-08-17 MED ORDER — ACETAMINOPHEN 325 MG PO TABS
650.0000 mg | ORAL_TABLET | Freq: Four times a day (QID) | ORAL | Status: DC | PRN
  Administered 2019-08-21: 650 mg via ORAL
  Filled 2019-08-17: qty 2

## 2019-08-17 MED ORDER — HYDROCOD POLST-CPM POLST ER 10-8 MG/5ML PO SUER
5.0000 mL | Freq: Two times a day (BID) | ORAL | Status: DC | PRN
  Administered 2019-08-17 – 2019-08-18 (×2): 5 mL via ORAL
  Filled 2019-08-17 (×2): qty 5

## 2019-08-17 MED ORDER — DEXAMETHASONE 4 MG PO TABS
6.0000 mg | ORAL_TABLET | ORAL | Status: DC
  Administered 2019-08-17: 13:00:00 6 mg via ORAL
  Filled 2019-08-17: qty 2

## 2019-08-17 MED ORDER — DEXAMETHASONE SODIUM PHOSPHATE 10 MG/ML IJ SOLN: 8.0000 mg | Freq: Once | INTRAMUSCULAR | Status: DC

## 2019-08-17 MED ORDER — FINASTERIDE 5 MG PO TABS
5.0000 mg | ORAL_TABLET | Freq: Every day | ORAL | Status: DC
  Administered 2019-08-17 – 2019-08-25 (×7): 5 mg via ORAL
  Filled 2019-08-17 (×11): qty 1

## 2019-08-17 MED ORDER — AMLODIPINE BESYLATE 5 MG PO TABS
5.0000 mg | ORAL_TABLET | Freq: Every day | ORAL | Status: DC
  Filled 2019-08-17: qty 1

## 2019-08-17 MED ORDER — DEXTROSE-NACL 5-0.45 % IV SOLN
INTRAVENOUS | Status: DC
  Administered 2019-08-17: 14:00:00 via INTRAVENOUS

## 2019-08-17 MED ORDER — TOCILIZUMAB 400 MG/20ML IV SOLN
8.0000 mg/kg | Freq: Once | INTRAVENOUS | Status: AC
  Administered 2019-08-17: 20:00:00 580 mg via INTRAVENOUS
  Filled 2019-08-17: qty 10

## 2019-08-17 MED ORDER — DEXAMETHASONE SODIUM PHOSPHATE 10 MG/ML IJ SOLN
10.0000 mg | INTRAMUSCULAR | Status: DC
  Administered 2019-08-18 – 2019-08-23 (×6): 10 mg via INTRAVENOUS
  Filled 2019-08-17 (×6): qty 1

## 2019-08-17 MED ORDER — SODIUM CHLORIDE 0.9% IV SOLUTION: Freq: Once | INTRAVENOUS | Status: DC

## 2019-08-17 NOTE — Progress Notes (Signed)
Patient was seen and evaluated upon presentation to [facility], patient quite dyspneic, requiring 15 L NRB, he is with significantly elevated CRP, his procalcitonin is normal, chest x-ray significant for multiple opacities, I have discussed convulsant plasma transfusion and Actemra with the patient Dr. Evette Cristal, and his wife Dr. Jaci Standard, I have explained risks and benefits, patient has no history of tuberculosis, hepatitis viral infection, bowel perforation or diverticulitis, he is not on any chemotherapy or immunotherapy and with no active malignancy,The treatment plan and use of medications and known side effects were discussed with patient/family, they were clearly explained that there is no proven definitive treatment for COVID-19 infection, any medications used here are based on published clinical articles/anecdotal data which are not peer-reviewed or randomized control trials.  Complete risks and long-term side effects are unknown, however in the best clinical judgment they seem to be of some clinical benefit rather than medical risks.  Patient/family agree with the treatment plan and want to receive the given medications.  Phillips Climes MD

## 2019-08-17 NOTE — ED Notes (Signed)
RT at bedside for blood gas

## 2019-08-17 NOTE — ED Notes (Signed)
Pt sats 87-89% on 15L NRB, Dr. Sherry Ruffing aware.

## 2019-08-17 NOTE — ED Notes (Signed)
Admitting at bedside 

## 2019-08-17 NOTE — ED Notes (Signed)
[facility] at bedside 

## 2019-08-17 NOTE — ED Notes (Signed)
RN attempted to give report.

## 2019-08-17 NOTE — ED Notes (Signed)
[facility] called, said it would be awhile before they could come get pt, no exact ETA.

## 2019-08-17 NOTE — ED Provider Notes (Signed)
[name] Provider Note   CSN: [id number] Arrival date & time: 08/17/19  0901     History   Chief Complaint Chief Complaint  Patient presents with   Covid+/SOB    HPI David Villegas is a 73 y.o. male.     The history is provided by the patient. No language interpreter was used.  Shortness of Breath Severity:  Severe Onset quality:  Gradual Duration:  1 week Timing:  Constant Progression:  Worsening Chronicity:  New Context: URI   Relieved by:  Nothing Worsened by:  Exertion Ineffective treatments:  Rest Associated symptoms: cough, fever and sputum production   Associated symptoms: no abdominal pain, no chest pain, no diaphoresis, no headaches, no vomiting and no wheezing   Risk factors: no hx of PE/DVT (recent negative PE study 2 days ago)     Past Medical History:  Diagnosis Date   High cholesterol    Hypertension    Hypothyroidism     There are no active problems to display for this patient.   History reviewed. No pertinent surgical history.      Home Medications    Prior to Admission medications   Medication Sig Start Date End Date Taking? Authorizing Provider  potassium chloride SA (KLOR-CON) 20 MEQ tablet Take 1 tablet (20 mEq total) by mouth daily. 08/15/19   Petrucelli, Glynda Jaeger, PA-C    Family History No family history on file.  Social History Social History   Tobacco Use   Smoking status: Never Smoker   Smokeless tobacco: Never Used  Substance Use Topics   Alcohol use: Never    Frequency: Never   Drug use: Never     Allergies   Patient has no known allergies.   Review of Systems Review of Systems  Constitutional: Positive for chills, fatigue and fever. Negative for diaphoresis.  HENT: Negative for congestion.   Respiratory: Positive for cough, sputum production, chest tightness and shortness of breath. Negative for wheezing and stridor.   Cardiovascular: Negative for chest  pain, palpitations and leg swelling.  Gastrointestinal: Negative for abdominal pain, constipation, diarrhea, nausea and vomiting.  Genitourinary: Negative for flank pain.  Musculoskeletal: Negative for back pain.  Neurological: Negative for light-headedness and headaches.  Psychiatric/Behavioral: Negative for agitation.  All other systems reviewed and are negative.    Physical Exam Updated Vital Signs BP 122/63    Pulse 71    Temp 100.3 F (37.9 C) (Oral)    Resp (!) 30    Ht 5\' 6"  (1.676 m)    Wt 72.6 kg    SpO2 91%    BMI 25.82 kg/m   Physical Exam Vitals signs and nursing note reviewed.  Constitutional:      General: He is not in acute distress.    Appearance: He is well-developed. He is ill-appearing. He is not toxic-appearing or diaphoretic.  HENT:     Head: Normocephalic and atraumatic.     Right Ear: External ear normal.     Left Ear: External ear normal.     Nose: Congestion present.     Mouth/Throat:     Mouth: Mucous membranes are moist.     Pharynx: No oropharyngeal exudate or posterior oropharyngeal erythema.  Eyes:     Extraocular Movements: Extraocular movements intact.     Conjunctiva/sclera: Conjunctivae normal.     Pupils: Pupils are equal, round, and reactive to light.  Neck:     Musculoskeletal: Normal range of motion and neck supple.  Cardiovascular:     Rate and Rhythm: Normal rate.     Pulses: Normal pulses.     Heart sounds: No murmur.  Pulmonary:     Effort: Respiratory distress present.     Breath sounds: No stridor. Rhonchi present. No wheezing or rales.  Chest:     Chest wall: No tenderness.  Abdominal:     General: Abdomen is flat.     Palpations: Abdomen is soft.     Tenderness: There is no abdominal tenderness. There is no right CVA tenderness, left CVA tenderness, guarding or rebound.     Hernia: A hernia is present.  Musculoskeletal:        General: No tenderness.     Right lower leg: No edema.     Left lower leg: No edema.  Skin:     General: Skin is warm.     Findings: No erythema or rash.  Neurological:     Mental Status: He is alert and oriented to person, place, and time.     Cranial Nerves: No cranial nerve deficit.     Motor: No abnormal muscle tone.     Coordination: Coordination normal.     Deep Tendon Reflexes: Reflexes normal.  Psychiatric:        Mood and Affect: Mood normal.      ED Treatments / Results  Labs (all labs ordered are listed, but only abnormal results are displayed) Labs Reviewed  LACTIC ACID, PLASMA - Abnormal; Notable for the following components:      Result Value   Lactic Acid, Venous 2.1 (*)    All other components within normal limits  LACTIC ACID, PLASMA - Abnormal; Notable for the following components:   Lactic Acid, Venous 2.2 (*)    All other components within normal limits  CBC WITH DIFFERENTIAL/PLATELET - Abnormal; Notable for the following components:   WBC 12.5 (*)    RBC 3.70 (*)    Hemoglobin 11.3 (*)    HCT 33.4 (*)    Neutro Abs 11.6 (*)    Lymphs Abs 0.6 (*)    Abs Immature Granulocytes 0.09 (*)    All other components within normal limits  COMPREHENSIVE METABOLIC PANEL - Abnormal; Notable for the following components:   CO2 20 (*)    Glucose, Bld 118 (*)    Calcium 8.1 (*)    Total Protein 6.3 (*)    Albumin 2.5 (*)    All other components within normal limits  BRAIN NATRIURETIC PEPTIDE - Abnormal; Notable for the following components:   B Natriuretic Peptide 128.8 (*)    All other components within normal limits  D-DIMER, QUANTITATIVE (NOT AT [facility]) - Abnormal; Notable for the following components:   D-Dimer, Quant 2.12 (*)    All other components within normal limits  C-REACTIVE PROTEIN - Abnormal; Notable for the following components:   CRP 16.2 (*)    All other components within normal limits  POCT I-STAT 7, (LYTES, BLD GAS, ICA,H+H) - Abnormal; Notable for the following components:   pCO2 arterial 28.1 (*)    pO2, Arterial 50.0 (*)     Bicarbonate 18.5 (*)    TCO2 19 (*)    Acid-base deficit 4.0 (*)    HCT 33.0 (*)    Hemoglobin 11.2 (*)    All other components within normal limits  CULTURE, BLOOD (ROUTINE X 2)  CULTURE, BLOOD (ROUTINE X 2)  FERRITIN  PROCALCITONIN  BLOOD GAS, ARTERIAL  ABO/RH  TROPONIN I (HIGH SENSITIVITY)  TROPONIN I (HIGH SENSITIVITY)    EKG EKG Interpretation  Date/Time:  Tuesday August 17 2019 09:11:18 EST Ventricular Rate:  72 PR Interval:    QRS Duration: 91 QT Interval:  409 QTC Calculation: 448 R Axis:   16 Text Interpretation: Sinus rhythm Borderline low voltage, extremity leads Abnormal R-wave progression, late transition When compared to prior, faster rate. No STEMI Confirmed by Antony Blackbird [phone]) on 08/17/2019 9:25:19 AM   Radiology Ct Angio Chest Pe W/cm &/or Wo Cm  Result Date: 08/15/2019 CLINICAL DATA:  Intermittent fever, dry cough for 2 weeks, tested COVID-19 positive this past Wednesday, new onset cough, history hypertension EXAM: CT ANGIOGRAPHY CHEST WITH CONTRAST TECHNIQUE: Multidetector CT imaging of the chest was performed using the standard protocol during bolus administration of intravenous contrast. Multiplanar CT image reconstructions and MIPs were obtained to evaluate the vascular anatomy. CONTRAST:  141mL OMNIPAQUE IOHEXOL 350 MG/ML SOLN IV COMPARISON:  None FINDINGS: Cardiovascular: Atherosclerotic calcifications aorta, coronary arteries and proximal great vessels. Aorta normal caliber. Upper normal size of cardiac chambers. No pericardial effusion. Pulmonary arteries well opacified and patent. No evidence of pulmonary embolism. Mediastinum/Nodes: Base of cervical region normal appearance. No thoracic adenopathy. Esophagus unremarkable. Lungs/Pleura: Extensive BILATERAL patchy ground-glass and airspace infiltrates consistent with multifocal pneumonia and history of COVID-19. No pleural effusion or pneumothorax. No discrete mass/nodule identified. Upper Abdomen:  Visualized upper abdomen normal appearance Musculoskeletal: Osseous structures unremarkable. Review of the MIP images confirms the above findings. IMPRESSION: No evidence of pulmonary embolism. Extensive BILATERAL patchy ground-glass and airspace infiltrates consistent with multifocal pneumonia due to COVID-19. Aortic Atherosclerosis (ICD10-I70.0). Electronically Signed   By: Lavonia Dana M.D.   On: 08/15/2019 21:11   Dg Chest Portable 1 View  Result Date: 08/17/2019 CLINICAL DATA:  covid positive, hypoxia, cough, on abx for PNAcovid positive, hypoxia, cough, on abx for PNA EXAM: PORTABLE CHEST 1 VIEW COMPARISON:  08/13/2019 FINDINGS: There are patchy parenchymal infiltrates bilaterally, significantly increased over the prior study and involving the RIGHT lung greater than the LEFT. Heart size is normal. No pulmonary edema. IMPRESSION: Significantly increased bilateral pulmonary infiltrates, RIGHT greater than LEFT. Electronically Signed   By: Nolon Nations M.D.   On: 08/17/2019 11:29    Procedures Procedures (including critical care time)  David Villegas was evaluated in Emergency Department on 08/17/2019 for the symptoms described in the history of present illness. He was evaluated in the context of the global COVID-19 pandemic, which necessitated consideration that the patient might be at risk for infection with the SARS-CoV-2 virus that causes COVID-19. Institutional protocols and algorithms that pertain to the evaluation of patients at risk for COVID-19 are in a state of rapid change based on information released by regulatory bodies including the CDC and federal and state organizations. These policies and algorithms were followed during the patient's care in the ED.  CRITICAL CARE Performed by: Gwenyth Allegra Junetta Hearn Total critical care time: 45 minutes Critical care time was exclusive of separately billable procedures and treating other patients. Critical care was necessary to treat or  prevent imminent or life-threatening deterioration. Critical care was time spent personally by me on the following activities: development of treatment plan with patient and/or surrogate as well as nursing, discussions with consultants, evaluation of patient's response to treatment, examination of patient, obtaining history from patient or surrogate, ordering and performing treatments and interventions, ordering and review of laboratory studies, ordering and review of radiographic studies, pulse oximetry and re-evaluation of patient's condition.   Medications Ordered  in ED Medications - No data to display   Initial Impression / Assessment and Plan / ED Course  I have reviewed the triage vital signs and the nursing notes.  Pertinent labs & imaging results that were available during my care of the patient were reviewed by me and considered in my medical decision making (see chart for details).        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  He was found to have pneumonia and completed acyclovir treatment before starting Decadron 2 days ago.  He came to the emergency department and had a negative CT PE study for pulmonary ballismus did show the pneumonia.  Patient was maintaining his oxygen saturations and was able to go home at that time.  Patient reports that his breathing has continued to worsen and I spoke with the wife who reports that they talk to an infectious disease specialist who recommended oral Decadron which she has been taking.  She reports he took a dose last night and then had 4 mg p.o. this  morning for coming to the emergency department.  He was found to have oxygen saturations in the 60s on room air at home, he does not have oxygen at home.  On arrival to the emergency department he is on a nonrebreather with saturations in the upper 80s and low 90s.  His breathing has improved from the 40s and is now in the 20s.  When he is not speaking, his oxygen saturation is around 90 but drops when he speaks.  He denies any chest pain or abdominal pain.  Denies nausea or vomiting.  He does still have some chills and cough.  On exam, patient has rhonchi in all lung fields but no wheezing was appreciated.  Chest and abdomen are nontender.  Patient moving all extremities.  He is alert and oriented.  I spoke with the patient's wife to discuss management plan.  At this time, with his oxygen saturations around 90% on the nonrebreather and his respiratory rate in the 20s, do not feel he needs intubation however we discussed if his tachypnea was to worsen and he began to tire out or his oxygen began dropping and not improving, we would likely need to go towards intubation.  Patient reports he is amenable to this plan wife understands as well.  Will get x-ray and labs prior to admitting patient.  If breathing status continues to decline, would likely touch base with critical care.  11:52 AM Patient's work-up again returned.  Patient does have leukocytosis and lactic acid 2.1.  On the nonrebreather he is remaining in the upper 80s and low 90s proximal and saturations.  I called critical care and spoke with Dr. Erskine Emery who felt that this patient will be an excellent candidate for a [facility] admission to the new unit for high flow nasal cannula.  He recommended holding on further antibiotics and admission to the hospitalist team for the [facility] admission.  X-ray did return showing worsened pulmonary infiltrates.  Will call hospitalist team for admission.   Final Clinical Impressions(s) / ED  Diagnoses   Final diagnoses:  COVID-19  Hypoxia  ED Discharge Orders    None     Clinical Impression: 1. COVID-19   2. Hypoxia     Disposition: Admit  This note was prepared with assistance of Dragon voice recognition software. Occasional wrong-word or sound-a-like substitutions may have occurred due to the inherent limitations of voice recognition software.     Callen Zuba, Gwenyth Allegra, MD 08/17/19 [phone]

## 2019-08-17 NOTE — Progress Notes (Signed)
Ok to change phenergan w/codeine to Tussionex per Dr Josephine Cables.   Onnie Boer, PharmD, BCIDP, AAHIVP, CPP Infectious Disease Pharmacist 08/17/2019 8:37 PM

## 2019-08-17 NOTE — ED Notes (Signed)
RN attempt to draw 2nd set of blood cultures, only obtained blue top.

## 2019-08-17 NOTE — ED Notes (Signed)
Pt given lunch tray.

## 2019-08-17 NOTE — H&P (Addendum)
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  He has been healthy.  He was tested positive for Covid approximately 1 week ago.  Approximately 5 days ago he developed early symptoms with cough.  Was seen on December 6 for possible pneumonia and was started on azithromycin.  Patient had a CT angio on 08/15/2019 which revealed no PE but the presence of bilateral groundglass infiltrates consistent with viral pneumonia.  Patient continued to decline and had increasing shortness of breath by pulse oximetry at home.  With O2 sats in the 80s he presented to [facility] emergency department for evaluation.  (For level 3, the HPI must include 4+ descriptors: Location, Quality, Severity, Duration, Timing, Context, modifying factors, associated signs/symptoms and/or status of 3+ chronic problems.)  (Please avoid self-populating past medical history here) (The initial 2-3 lines should be focused and good to copy and paste in the HPI section of the daily progress note).  ED Course: In the emergency department the patient was hemodynamically stable.  His oxygen saturation was in the 60% range.  Patient was immediately put to 100% nonrebreather facemask at 15 L flow.  O2 saturation improved to 90% as long as he was at rest.  With drop into the 80% range and with talking or movement.  Patient had taken 4 mg of Decadron before coming to the emergency department and was given 2 mg of Decadron in the ED.  Chest x-ray did confirm diffuse  groundglass infiltrates right greater than left.  Troponin was mildly elevated at 13.  BNP was normal at 129.  Patient had a mild leukocytosis of 12,500 with 92% segs 5% lymphs 2% monos.  Referred to [facility] for admission for COVID-19 pneumonia 3 to [facility]  Review of Systems: As per HPI otherwise 10 point review of systems negative.  Patient particularly complains of cough with painful chest with coughing.  Past Medical History:  Diagnosis Date  . BPH (benign prostatic hyperplasia)   . High cholesterol   . Hypertension   . Hypothyroidism     Past Surgical History:  Procedure Laterality Date  . MASTOIDECTOMY Left   . NASAL SEPTUM SURGERY    . TONSILECTOMY, ADENOIDECTOMY, BILATERAL MYRINGOTOMY AND TUBES     Social history -patient is married.  He has grown children.  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  Wife is a medical doctor   reports that he has never smoked. He has never used smokeless tobacco. He reports that he does not drink alcohol or use drugs.  No Known Allergies  Family History  Problem Relation Age of Onset  . Heart attack Father    Unacceptable: Noncontributory, unremarkable, or negative. Acceptable: Family history reviewed and not pertinent (If you reviewed it)  Prior to Admission medications   Medication Sig Start Date End Date Taking? Authorizing Provider  potassium chloride SA (KLOR-CON) 20 MEQ tablet Take 1 tablet (20 mEq total) by mouth daily. 08/15/19   Petrucelli, Glynda Jaeger, PA-C  Physical Exam: Vitals:   08/17/19 1115 08/17/19 1130 08/17/19 1145 08/17/19 1200  BP: 138/71 128/71 128/71 126/65  Pulse: 80 80 82 73  Resp: (!) 33 (!) 26 (!) 22 (!) 33  Temp:      TempSrc:      SpO2: (!) 89% (!) 87% (!) 88% (!) 88%  Weight:      Height:        Constitutional: NAD, calm, comfortable Vitals:   08/17/19 1115 08/17/19 1130 08/17/19 1145 08/17/19 1200  BP: 138/71 128/71 128/71 126/65  Pulse: 80  80 82 73  Resp: (!) 33 (!) 26 (!) 22 (!) 33  Temp:      TempSrc:      SpO2: (!) 89% (!) 87% (!) 88% (!) 88%  Weight:      Height:        General appearance well-nourished well-developed gentleman who is in mild respiratory distress :eyes: PERRL, lids and conjunctivae normal ENMT: Mucous membranes are moist. Posterior pharynx clear of any exudate or lesions.Normal dentition.  Neck: normal, supple, no masses, no thyromegaly Respiratory: Mild increased work of breathing, tachypnea.  Persistent dry cough.  Decreased breath sounds at both bases.  Dentition of breath sounds in the right upper lobe  cardiovascular: Regular rate and rhythm, no murmurs / rubs / gallops. No extremity edema. 2+ pedal pulses. No carotid bruits.  Abdomen: no tenderness, no masses palpated. No hepatosplenomegaly. Bowel sounds positive.  Musculoskeletal: no clubbing / cyanosis. No joint deformity upper and lower extremities. Good ROM, no contractures. Normal muscle tone.  Skin: no rashes, lesions, ulcers. No induration Neurologic: CN 2-12 grossly intact. Sensation intact, DTR normal. Strength 5/5 in all 4.  Psychiatric: Normal judgment and insight. Alert and oriented x 3. Normal mood.   (Anything < 9 systems with 2 bullets each down codes to level 1) (If patient refuses exam can't bill higher level) (Make sure to document decubitus ulcers present on admission -- if possible -- and whether patient has chronic indwelling catheter at time of admission)  Labs on Admission: I have personally reviewed following labs and imaging studies  CBC: Recent Labs  Lab 08/15/19 1740 08/17/19 0949 08/17/19 1156  WBC 9.9 12.5*  --   NEUTROABS 8.7* 11.6*  --   HGB 12.3* 11.3* 11.2*  HCT 36.5* 33.4* 33.0*  MCV 89.5 90.3  --   PLT 256 279  --    Basic Metabolic Panel: Recent Labs  Lab 08/15/19 1740 08/17/19 0949 08/17/19 1156  NA 137 139 139  K 3.3* 3.9 3.6  CL 105 110  --   CO2 21* 20*  --   GLUCOSE 101* 118*  --   BUN  20 18  --   CREATININE 0.80 0.72  --   CALCIUM 8.0* 8.1*  --    GFR: Estimated Creatinine Clearance: 74.2 mL/min (by C-G formula based on SCr of 0.72 mg/dL). Liver Function Tests: Recent Labs  Lab 08/15/19 1740 08/17/19 0949  AST 38 31  ALT 43 30  ALKPHOS 45 40  BILITOT 0.2* 0.6  PROT 6.5 6.3*  ALBUMIN 3.0* 2.5*   No results for input(s): LIPASE, AMYLASE in the last 168 hours. No results for input(s): AMMONIA in the last 168 hours. Coagulation Profile: No results for input(s): INR, PROTIME in the last 168 hours. Cardiac Enzymes: No results for input(s): CKTOTAL, CKMB, CKMBINDEX, TROPONINI in the last 168 hours. BNP (last 3 results) No results for input(s): PROBNP in the last 8760 hours. HbA1C: No results  for input(s): HGBA1C in the last 72 hours. CBG: No results for input(s): GLUCAP in the last 168 hours. Lipid Profile: No results for input(s): CHOL, HDL, LDLCALC, TRIG, CHOLHDL, LDLDIRECT in the last 72 hours. Thyroid Function Tests: No results for input(s): TSH, T4TOTAL, FREET4, T3FREE, THYROIDAB in the last 72 hours. Anemia Panel: No results for input(s): VITAMINB12, FOLATE, FERRITIN, TIBC, IRON, RETICCTPCT in the last 72 hours. Urine analysis: No results found for: COLORURINE, APPEARANCEUR, [place], [place], GLUCOSEU, HGBUR, BILIRUBINUR, KETONESUR, PROTEINUR, UROBILINOGEN, NITRITE, LEUKOCYTESUR  Radiological Exams on Admission: Ct Angio Chest Pe W/cm &/or Wo Cm  Result Date: 08/15/2019 CLINICAL DATA:  Intermittent fever, dry cough for 2 weeks, tested COVID-19 positive this past Wednesday, new onset cough, history hypertension EXAM: CT ANGIOGRAPHY CHEST WITH CONTRAST TECHNIQUE: Multidetector CT imaging of the chest was performed using the standard protocol during bolus administration of intravenous contrast. Multiplanar CT image reconstructions and MIPs were obtained to evaluate the vascular anatomy. CONTRAST:  152mL OMNIPAQUE IOHEXOL 350 MG/ML SOLN IV COMPARISON:  None  FINDINGS: Cardiovascular: Atherosclerotic calcifications aorta, coronary arteries and proximal great vessels. Aorta normal caliber. Upper normal size of cardiac chambers. No pericardial effusion. Pulmonary arteries well opacified and patent. No evidence of pulmonary embolism. Mediastinum/Nodes: Base of cervical region normal appearance. No thoracic adenopathy. Esophagus unremarkable. Lungs/Pleura: Extensive BILATERAL patchy ground-glass and airspace infiltrates consistent with multifocal pneumonia and history of COVID-19. No pleural effusion or pneumothorax. No discrete mass/nodule identified. Upper Abdomen: Visualized upper abdomen normal appearance Musculoskeletal: Osseous structures unremarkable. Review of the MIP images confirms the above findings. IMPRESSION: No evidence of pulmonary embolism. Extensive BILATERAL patchy ground-glass and airspace infiltrates consistent with multifocal pneumonia due to COVID-19. Aortic Atherosclerosis (ICD10-I70.0). Electronically Signed   By: Lavonia Dana M.D.   On: 08/15/2019 21:11   Dg Chest Portable 1 View  Result Date: 08/17/2019 CLINICAL DATA:  covid positive, hypoxia, cough, on abx for PNAcovid positive, hypoxia, cough, on abx for PNA EXAM: PORTABLE CHEST 1 VIEW COMPARISON:  08/13/2019 FINDINGS: There are patchy parenchymal infiltrates bilaterally, significantly increased over the prior study and involving the RIGHT lung greater than the LEFT. Heart size is normal. No pulmonary edema. IMPRESSION: Significantly increased bilateral pulmonary infiltrates, RIGHT greater than LEFT. Electronically Signed   By: Nolon Nations M.D.   On: 08/17/2019 11:29    EKG: Independently reviewed.  Twelve-lead EKG revealed normal sinus rhythm with no acute changes.  Assessment/Plan Active Problems:   Pneumonia due to COVID-19 virus   Essential hypertension   Hypothyroidism   HLD (hyperlipidemia)   BPH (benign prostatic hyperplasia)  (please populate well all problems here in  Problem List. (For example, if patient is on BP meds at home and you resume or decide to hold them, it is a problem that needs to be her. Same for CAD, COPD, HLD and so on)   1.  COVID-19 pneumonia -patient with groundglass infiltrates on chest x-ray, hypoxemia of that is profound, Covid positive testing.  Patient has been treated with azithromycin for several days.  He was switched to doxycycline with concern for possible bacterial infection.  Now with a mild leukocytosis however, the patient's been taking Decadron for 2 days.  Patient currently on of 100% nonrebreather mask at 15 L flow.  Per EDP he discussed patient's care with critical care medicine who recommended draining from intubation if possible recommended oxygen support and proning. Plan admit to [facility] stepdown  Continue oxygen support  Continue Decadron at 6 mg daily  Remdesivir per pharmacy  consult  Will check precalcitonin before continuing Abx  Phenergan with codeine cough syrup every 6 as needed  Ketorolac 15 mg IV every 6 hours as needed chest pain related to cough  Enteral hydration D5 half-normal saline at 50 cc an hour  2 hypertension -BP is stable. Patient's wife, an MD, has been holding his meds 2/2 low BP Plan continue home medications: Amlodipine 5 mg daily, Terazosin 2 mg daily in 1-2 days as e stablizes   DVT prophylaxis: Lovenox (Lovenox/Heparin/SCD's/anticoagulated/None (if comfort care) Code Status: Full code (Full/Partial (specify details) Family Communication: Spoke with patient's wife Dr.Kaur, answered many questions.Specify name, relationship. Do not write "discussed with patient". Specify tel # if discussed over the phone) Disposition Plan: Home when medically stable (specify when and where you expect patient to be discharged) Consults called: EDP discussed with CCM (with names) Admission status: Inpatient (inpatient / obs / tele / medical floor / SDU)   Adella Hare MD [facility] Pager [phone]  If 7PM-7AM, please contact night-coverage [email or web address] Password [facility]  08/17/2019, 12:48 PM

## 2019-08-17 NOTE — Plan of Care (Signed)
  Problem: Nutrition: Goal: Adequate nutrition will be maintained Outcome: Progressing   

## 2019-08-17 NOTE — ED Triage Notes (Signed)
Pt diagnosed with COVID 10 days ago and PNA- from home. 60% on room air- improved to 88% on nonrebreather. HR 84, resp 30. Pt is alert and oriented. Diminished lung sounds.

## 2019-08-18 ENCOUNTER — Inpatient Hospital Stay (HOSPITAL_COMMUNITY): Payer: PPO

## 2019-08-18 LAB — COMPREHENSIVE METABOLIC PANEL
ALT: 45 U/L — ABNORMAL HIGH (ref 0–44)
AST: 53 U/L — ABNORMAL HIGH (ref 15–41)
Albumin: 2.4 g/dL — ABNORMAL LOW (ref 3.5–5.0)
Alkaline Phosphatase: 41 U/L (ref 38–126)
Anion gap: 7 (ref 5–15)
BUN: 27 mg/dL — ABNORMAL HIGH (ref 8–23)
CO2: 22 mmol/L (ref 22–32)
Calcium: 7.8 mg/dL — ABNORMAL LOW (ref 8.9–10.3)
Chloride: 110 mmol/L (ref 98–111)
Creatinine, Ser: 0.5 mg/dL — ABNORMAL LOW (ref 0.61–1.24)
GFR calc Af Amer: >60 mL/min (ref >60)
GFR calc non Af Amer: >60 mL/min (ref >60)
Glucose, Bld: 128 mg/dL — ABNORMAL HIGH (ref 70–99)
Potassium: 3.9 mmol/L (ref 3.5–5.1)
Sodium: 139 mmol/L (ref 135–145)
Total Bilirubin: 0.8 mg/dL (ref 0.3–1.2)
Total Protein: 6.1 g/dL — ABNORMAL LOW (ref 6.5–8.1)

## 2019-08-18 LAB — ABO/RH: ABO/RH(D): B POS

## 2019-08-18 LAB — HEPATITIS B SURFACE ANTIGEN: Hepatitis B Surface Ag: NONREACTIVE

## 2019-08-18 LAB — GLUCOSE, CAPILLARY
Glucose-Capillary: 134 mg/dL — ABNORMAL HIGH (ref 70–99)
Glucose-Capillary: 126 mg/dL — ABNORMAL HIGH (ref 70–99)

## 2019-08-18 LAB — CBC
HCT: 32.8 % — ABNORMAL LOW (ref 39.0–52.0)
Hemoglobin: 11 g/dL — ABNORMAL LOW (ref 13.0–17.0)
MCH: 30 pg (ref 26.0–34.0)
MCHC: 33.5 g/dL (ref 30.0–36.0)
MCV: 89.4 fL (ref 80.0–100.0)
Platelets: 289 10*3/uL (ref 150–400)
RBC: 3.67 MIL/uL — ABNORMAL LOW (ref 4.22–5.81)
RDW: 14.5 % (ref 11.5–15.5)
WBC: 8.6 10*3/uL (ref 4.0–10.5)
nRBC: 0 % (ref 0.0–0.2)

## 2019-08-18 LAB — D-DIMER, QUANTITATIVE: D-Dimer, Quant: 1.84 ug/mL-FEU — ABNORMAL HIGH (ref 0.00–0.50)

## 2019-08-18 LAB — C-REACTIVE PROTEIN: CRP: 13.6 mg/dL — ABNORMAL HIGH (ref <1.0)

## 2019-08-18 MED ORDER — ENSURE ENLIVE PO LIQD
237.0000 mL | Freq: Two times a day (BID) | ORAL | Status: DC
  Administered 2019-08-18 – 2019-08-24 (×11): 237 mL via ORAL

## 2019-08-18 MED ORDER — ALBUTEROL SULFATE HFA 108 (90 BASE) MCG/ACT IN AERS
2.0000 | INHALATION_SPRAY | Freq: Four times a day (QID) | RESPIRATORY_TRACT | Status: DC
  Administered 2019-08-18 – 2019-08-24 (×23): 2 via RESPIRATORY_TRACT
  Filled 2019-08-18 (×2): qty 6.7

## 2019-08-18 MED ORDER — TOCILIZUMAB 400 MG/20ML IV SOLN
8.0000 mg/kg | Freq: Once | INTRAVENOUS | Status: AC
  Administered 2019-08-18: 12:00:00 580 mg via INTRAVENOUS
  Filled 2019-08-18: qty 10

## 2019-08-18 MED ORDER — SODIUM CHLORIDE 0.9 % IV SOLN: INTRAVENOUS | Status: DC

## 2019-08-18 MED ORDER — GUAIFENESIN-CODEINE 100-10 MG/5ML PO SOLN
5.0000 mL | Freq: Four times a day (QID) | ORAL | Status: DC | PRN
  Administered 2019-08-18 – 2019-08-20 (×4): 5 mL via ORAL
  Filled 2019-08-18 (×4): qty 5

## 2019-08-18 NOTE — Progress Notes (Addendum)
Initial Nutrition Assessment  RD working remotely.  DOCUMENTATION CODES:   Not applicable  INTERVENTION:  Provide Ensure Enlive po BID, each supplement provides 350 kcal and 20 grams of protein.  NUTRITION DIAGNOSIS:   Increased nutrient needs related to catabolic [id number] as evidenced by estimated needs.  GOAL:   Patient will meet greater than or equal to 90% of their needs  MONITOR:   PO intake, Supplement acceptance, Labs, Weight trends, I & O's  REASON FOR ASSESSMENT:   Malnutrition Screening Tool    ASSESSMENT:   73 year old male with PMHx of HTN, HLD, hypothyroidism, BPH admitted with COVID-19 PNA.   Attempted to call patient's phone but he was unable to answer. According to chart patient was initially diagnosed with COVID 10 days prior to admission. No meal documentation completed at this time so unable to tell how he is eating. There is a care order in chart that wife can bring food from home for patient, which will help improve PO intake. Patient would also benefit from oral nutrition supplement in setting of increased nutrient needs. Skin is intact.  Limited weight history available in chart. Patient was 74.8 kg on 08/15/2019. Currently listed as 72.6 kg (160 lbs) but this may be estimated as he has likely not lost 2.2 kg in 2 days.  Medications reviewed and include: Decadron 10 mg Q24hrs, Crestor, Flomax, famotidine, remdesivir, tocilizumab.  Labs reviewed: BUN 27, Creatinine 0.5.  Unable to determine if patient meets criteria for malnutrition at this time.  NUTRITION - FOCUSED PHYSICAL EXAM:  Unable to complete.  Diet Order:   Diet Order            Diet regular Room service appropriate? Yes; Fluid consistency: Thin  Diet effective now             EDUCATION NEEDS:   No education needs have been identified at this time  Skin:  Skin Assessment: Reviewed RN Assessment  Last BM:  Unknown  Height:   Ht Readings from Last 1 Encounters:   08/17/19 5\' 6"  (1.676 m)   Weight:   Wt Readings from Last 1 Encounters:  08/17/19 72.6 kg   Ideal Body Weight:  64.5 kg  BMI:  Body mass index is 25.82 kg/m.  Estimated Nutritional Needs:   Kcal:  1850-2150  Protein:  93-108 grams  Fluid:  1.8 L/day  Jacklynn Barnacle, MS, RD, LDN Office: [phone] Pager: [phone] After Hours/Weekend Pager: [phone]

## 2019-08-18 NOTE — Progress Notes (Signed)
   08/18/19 0031  MEWS Score  Resp (!) 30  ECG Heart Rate 75  Pulse Rate 77  BP 120/74  Temp 99 F (37.2 C)  SpO2 98 %  O2 Device Non-rebreather Mask  O2 Flow Rate (L/min) 15 L/min  MEWS Score  MEWS RR 2  MEWS Pulse 0  MEWS Systolic 0  MEWS LOC 0  MEWS Temp 0  MEWS Score 2  MEWS Score Color Yellow

## 2019-08-18 NOTE — Progress Notes (Signed)
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  Patient started having symptoms of fever and cough at around Thanksgiving, 11/26, tested positive about 1 week ago.  He started developing more shortness of breath, on 12/6 was seen in the emergency room, no hypoxia on ambulation, sent home with azithromycin.  Came back with worsening shortness of breath and hypoxia.  In the emergency room, his saturations were 60% and he started 100% nonrebreather.  Chest x-ray showed multifocal pneumonia.  Admit to the hospital with multifocal pneumonia due to COVID-19 with worsening hypoxemia. 08/18/2019: Remains hypoxemic on 100% nonrebreather.   Assessment & Plan:   Active Problems:   Pneumonia due to COVID-19 virus   Essential hypertension   Hypothyroidism   HLD (hyperlipidemia)   BPH (benign prostatic hyperplasia)  Pneumonia due to COVID-19 virus, acute respiratory disease due to COVID-19 with acute hypoxemic respiratory failure: Continue to monitor due to significant symptoms  chest physiotherapy, incentive spirometry, deep breathing exercises, sputum induction, mucolytic's and bronchodilators. Supplemental oxygen to keep saturations more than 89%. Covid directed therapy with , steroids dexamethasone remdesivir day 2/5 actemra dose on 12/8, repeating dose on 12/9 convalescent plasma , ordered, lab getting plasma from out of town antibiotics not indicated Due to severity of symptoms, patient will need daily inflammatory markers, chest x-rays, liver function test to monitor and direct COVID-19 therapies. Repeat chest x-ray today shows worsening multifocal infiltrates. On Lovenox subcu for prophylactic dose.  Hypertension: Blood pressure stable.  He is on amlodipine and Terazosin.  Medicine on hold.  Will monitor.  Hyperlipidemia:  On a statin at home.  Continue.  BPH: On Flomax that we will continue.   DVT prophylaxis: Lovenox subcu Code Status: Full code Family Communication: Patient's wife, Dr. Evette Cristal Disposition Plan: Critically ill, remains in the hospital   Consultants:   None  Procedures:   None  Antimicrobials:   Remdesivir, 08/17/2019---   Subjective: Patient seen and examined.  Overnight events noted.  He had streaky hemoptysis.  Feels chest tightness.  Coughing will also hurt his umbilical hernia. Afebrile overnight, however very short of breath. Remained on 100% nonrebreather overnight and still oxygenating 89-90%. Discussed in detail with patient about ongoing treatment plan, also discussed with patient's wife who is also a primary care physician. Feels dry.  Wants to see if somebody can bring some food from home that he can eat.  Objective: Vitals:   08/18/19 0743 08/18/19 0820 08/18/19 1000 08/18/19 1139  BP: 99/78 99/78 118/67 115/63  Pulse: 70 62 61 77  Resp: (!) 28 (!) 30  (!) 25  Temp:  (!) 97.3 F (36.3 C) (!) 97.3 F (36.3 C) 97.8 F (36.6 C)  TempSrc:  Axillary Axillary Axillary  SpO2: (!) 88% 94% 92% (!) 89%  Weight:      Height:        Intake/Output Summary (Last 24 hours) at 08/18/2019 1145 Last data filed at 08/18/2019 0600 Gross per 24 hour  Intake 400.02 ml  Output -  Net 400.02 ml   Filed Weights   08/17/19 0939  Weight: 72.6 kg    Examination:  General exam: Appears anxious, on high flow oxygen.  Able to communicate with moderate distress. Respiratory system: Poor bilateral air entry.  No added sounds.  On 15 L nonrebreather. Cardiovascular system: S1 & S2 heard, RRR. No  JVD, murmurs, rubs, gallops or clicks. No pedal edema. Gastrointestinal system: Abdomen is nondistended, soft and nontender. No organomegaly or masses felt. Normal bowel sounds heard. Central nervous system: Alert and oriented. No focal neurological deficits. Extremities: Symmetric 5 x  5 power. Skin: No rashes, lesions or ulcers Psychiatry: Judgement and insight appear normal. Mood & affect anxious.    Data Reviewed: I have personally reviewed following labs and imaging studies  CBC: Recent Labs  Lab 08/15/19 1740 08/17/19 0949 08/17/19 1156 08/18/19 0158  WBC 9.9 12.5*  --  8.6  NEUTROABS 8.7* 11.6*  --   --   HGB 12.3* 11.3* 11.2* 11.0*  HCT 36.5* 33.4* 33.0* 32.8*  MCV 89.5 90.3  --  89.4  PLT 256 279  --  [id number]   Basic Metabolic Panel: Recent Labs  Lab 08/15/19 1740 08/17/19 0949 08/17/19 1156 08/18/19 0158  NA 137 139 139 139  K 3.3* 3.9 3.6 3.9  CL 105 110  --  110  CO2 21* 20*  --  22  GLUCOSE 101* 118*  --  128*  BUN 20 18  --  27*  CREATININE 0.80 0.72  --  0.50*  CALCIUM 8.0* 8.1*  --  7.8*   GFR: Estimated Creatinine Clearance: 74.2 mL/min (A) (by C-G formula based on SCr of 0.5 mg/dL (L)). Liver Function Tests: Recent Labs  Lab 08/15/19 1740 08/17/19 0949 08/18/19 0158  AST 38 31 53*  ALT 43 30 45*  ALKPHOS 45 40 41  BILITOT 0.2* 0.6 0.8  PROT 6.5 6.3* 6.1*  ALBUMIN 3.0* 2.5* 2.4*   No results for input(s): LIPASE, AMYLASE in the last 168 hours. No results for input(s): AMMONIA in the last 168 hours. Coagulation Profile: No results for input(s): INR, PROTIME in the last 168 hours. Cardiac Enzymes: No results for input(s): CKTOTAL, CKMB, CKMBINDEX, TROPONINI in the last 168 hours. BNP (last 3 results) No results for input(s): PROBNP in the last 8760 hours. HbA1C: No results for input(s): HGBA1C in the last 72 hours. CBG: No results for input(s): GLUCAP in the last 168 hours. Lipid Profile: No results for input(s): CHOL, HDL, LDLCALC, TRIG, CHOLHDL, LDLDIRECT in the last 72 hours. Thyroid Function Tests: No results for input(s): TSH, T4TOTAL, FREET4, T3FREE, THYROIDAB in the last 72 hours. Anemia Panel: Recent Labs    08/17/19 1345  FERRITIN 251   Sepsis Labs: Recent Labs  Lab 08/15/19 1740 08/17/19 0949  08/17/19 1145 08/17/19 1345  PROCALCITON  --   --   --  <0.10  LATICACIDVEN 1.6 2.1* 2.2*  --     Recent Results (from the past 240 hour(s))  Blood culture (routine x 2)     Status: None (Preliminary result)   Collection Time: 08/15/19  5:40 PM   Specimen: BLOOD  Result Value Ref Range Status   Specimen Description BLOOD LEFT ANTECUBITAL  Final   Special Requests   Final    BOTTLES DRAWN AEROBIC AND ANAEROBIC Blood Culture results may not be optimal due to an inadequate volume of blood received in culture bottles   Culture   Final    NO GROWTH 3 DAYS Performed at [facility] [address] [place], [place] [address]    Report Status PENDING  Incomplete  Blood culture (routine x 2)     Status: None (Preliminary result)   Collection Time: 08/15/19  5:42 PM   Specimen: BLOOD  Result Value Ref Range Status   Specimen Description BLOOD RIGHT ANTECUBITAL  Final   Special Requests   Final    BOTTLES DRAWN AEROBIC AND ANAEROBIC Blood Culture results may not be optimal due to an excessive volume of blood received in culture bottles   Culture   Final    NO GROWTH 3 DAYS Performed at [facility], [place] [address], [place], [place] [address]    Report Status PENDING  Incomplete  Blood culture (routine x 2)     Status: None (Preliminary result)   Collection Time: 08/17/19 11:36 AM   Specimen: BLOOD  Result Value Ref Range Status   Specimen Description BLOOD LEFT ANTECUBITAL  Final   Special Requests   Final    BOTTLES DRAWN AEROBIC AND ANAEROBIC Blood Culture adequate volume   Culture   Final    NO GROWTH < 24 HOURS Performed at [facility], [place] [address], [place], [place] [address]    Report Status PENDING  Incomplete  Blood culture (routine x 2)     Status: None (Preliminary result)   Collection Time: 08/17/19 12:04 PM   Specimen: BLOOD RIGHT HAND  Result Value Ref Range Status   Specimen Description BLOOD RIGHT HAND  Final   Special Requests   Final     BOTTLES DRAWN AEROBIC ONLY Blood Culture results may not be optimal due to an inadequate volume of blood received in culture bottles   Culture   Final    NO GROWTH < 24 HOURS Performed at [facility], [place] [address], [place], [place] [address]    Report Status PENDING  Incomplete         Radiology Studies: Dg Chest Port 1 View  Result Date: 08/18/2019 CLINICAL DATA:  Pneumonia. EXAM: PORTABLE CHEST 1 VIEW COMPARISON:  08/17/2019 and CT chest 08/15/2019. FINDINGS: Trachea is midline. Heart size stable. Fairly diffuse bilateral airspace opacification, with slight sparing of the left upper lobe. Probable tiny bilateral pleural effusions. No pneumothorax. Findings are similar to 08/17/2019. IMPRESSION: 1. Fairly diffuse bilateral airspace disease with relative sparing of the left upper lobe, similar to yesterday's exam and in keeping with COVID pneumonia. 2. Probable tiny bilateral pleural effusions. Electronically Signed   By: Lorin Picket M.D.   On: 08/18/2019 10:19   Dg Chest Portable 1 View  Result Date: 08/17/2019 CLINICAL DATA:  covid positive, hypoxia, cough, on abx for PNAcovid positive, hypoxia, cough, on abx for PNA EXAM: PORTABLE CHEST 1 VIEW COMPARISON:  08/13/2019 FINDINGS: There are patchy parenchymal infiltrates bilaterally, significantly increased over the prior study and involving the RIGHT lung greater than the LEFT. Heart size is normal. No pulmonary edema. IMPRESSION: Significantly increased bilateral pulmonary infiltrates, RIGHT greater than LEFT. Electronically Signed   By: Nolon Nations M.D.   On: 08/17/2019 11:29        Scheduled Meds: . sodium chloride   Intravenous Once  . albuterol  2 puff Inhalation Q6H  . dexamethasone (DECADRON) injection  10 mg Intravenous Q24H  . enoxaparin (LOVENOX) injection  40 mg Subcutaneous Q24H  . finasteride  5 mg Oral Daily  . rosuvastatin  10 mg Oral q1800  . tamsulosin  0.4 mg Oral Daily   Continuous Infusions: .  famotidine (PEPCID) IV 20 mg (08/18/19 1025)  . remdesivir 100 mg in NS 100 mL 100 mg (08/18/19 0831)  . tocilizumab (ACTEMRA) IV       LOS: 1 day    Time spent: 35 minutes    Barb Merino, MD [facility] Pager [phone]

## 2019-08-18 NOTE — Progress Notes (Signed)
   08/18/19 1400  Family/Significant Other Communication  Family/Significant Other Update Other (Comment) (PT wife brought home clothing and food)

## 2019-08-18 NOTE — Progress Notes (Signed)
Spoke with David Villegas to send request for Plasma to blood bank per [facility] rep

## 2019-08-18 NOTE — Progress Notes (Signed)
   08/18/19 0800  Family/Significant Other Communication  Family/Significant Other Update Called;Updated (Spoke with Wife)

## 2019-08-19 LAB — CBC
HCT: 33.1 % — ABNORMAL LOW (ref 39.0–52.0)
Hemoglobin: 11.1 g/dL — ABNORMAL LOW (ref 13.0–17.0)
MCH: 29.8 pg (ref 26.0–34.0)
MCHC: 33.5 g/dL (ref 30.0–36.0)
MCV: 88.7 fL (ref 80.0–100.0)
Platelets: 366 10*3/uL (ref 150–400)
RBC: 3.73 MIL/uL — ABNORMAL LOW (ref 4.22–5.81)
RDW: 14.4 % (ref 11.5–15.5)
WBC: 7.5 10*3/uL (ref 4.0–10.5)
nRBC: 0 % (ref 0.0–0.2)

## 2019-08-19 LAB — PREPARE FRESH FROZEN PLASMA: Unit division: 0

## 2019-08-19 LAB — COMPREHENSIVE METABOLIC PANEL
ALT: 56 U/L — ABNORMAL HIGH (ref 0–44)
AST: 53 U/L — ABNORMAL HIGH (ref 15–41)
Albumin: 2.3 g/dL — ABNORMAL LOW (ref 3.5–5.0)
Alkaline Phosphatase: 42 U/L (ref 38–126)
Anion gap: 10 (ref 5–15)
BUN: 31 mg/dL — ABNORMAL HIGH (ref 8–23)
CO2: 22 mmol/L (ref 22–32)
Calcium: 7.9 mg/dL — ABNORMAL LOW (ref 8.9–10.3)
Chloride: 109 mmol/L (ref 98–111)
Creatinine, Ser: 0.56 mg/dL — ABNORMAL LOW (ref 0.61–1.24)
GFR calc Af Amer: >60 mL/min (ref >60)
GFR calc non Af Amer: >60 mL/min (ref >60)
Glucose, Bld: 146 mg/dL — ABNORMAL HIGH (ref 70–99)
Potassium: 3.6 mmol/L (ref 3.5–5.1)
Sodium: 141 mmol/L (ref 135–145)
Total Bilirubin: 0.5 mg/dL (ref 0.3–1.2)
Total Protein: 5.9 g/dL — ABNORMAL LOW (ref 6.5–8.1)

## 2019-08-19 LAB — D-DIMER, QUANTITATIVE: D-Dimer, Quant: 3.65 ug/mL-FEU — ABNORMAL HIGH (ref 0.00–0.50)

## 2019-08-19 LAB — BPAM FFP
Blood Product Expiration Date: 202012101339
ISSUE DATE / TIME: 202012092200
Unit Type and Rh: 7300

## 2019-08-19 LAB — GLUCOSE, CAPILLARY
Glucose-Capillary: 172 mg/dL — ABNORMAL HIGH (ref 70–99)
Glucose-Capillary: 178 mg/dL — ABNORMAL HIGH (ref 70–99)
Glucose-Capillary: 136 mg/dL — ABNORMAL HIGH (ref 70–99)

## 2019-08-19 LAB — C-REACTIVE PROTEIN: CRP: 7.4 mg/dL — ABNORMAL HIGH (ref <1.0)

## 2019-08-19 LAB — HEPATITIS B SURFACE ANTIBODY, QUANTITATIVE: Hep B S AB Quant (Post): 701 m[IU]/mL (ref >9.9)

## 2019-08-19 MED ORDER — OXYCODONE HCL 5 MG PO TABS
2.5000 mg | ORAL_TABLET | Freq: Four times a day (QID) | ORAL | Status: DC | PRN
  Administered 2019-08-19: 2.5 mg via ORAL
  Filled 2019-08-19 (×2): qty 1

## 2019-08-19 MED ORDER — AMLODIPINE BESYLATE 5 MG PO TABS
5.0000 mg | ORAL_TABLET | Freq: Every day | ORAL | Status: DC
  Administered 2019-08-20 – 2019-08-26 (×7): 5 mg via ORAL
  Filled 2019-08-19 (×7): qty 1

## 2019-08-19 MED ORDER — MENTHOL 3 MG MT LOZG
1.0000 | LOZENGE | OROMUCOSAL | Status: DC | PRN
  Administered 2019-08-19: 3 mg via ORAL
  Filled 2019-08-19: qty 9

## 2019-08-19 MED ORDER — SENNOSIDES-DOCUSATE SODIUM 8.6-50 MG PO TABS
1.0000 | ORAL_TABLET | Freq: Two times a day (BID) | ORAL | Status: DC
  Administered 2019-08-19 – 2019-08-25 (×6): 1 via ORAL
  Filled 2019-08-19 (×11): qty 1

## 2019-08-19 MED ORDER — PHENOL 1.4 % MT LIQD
1.0000 | OROMUCOSAL | Status: DC | PRN
  Administered 2019-08-19: 1 via OROMUCOSAL
  Filled 2019-08-19: qty 177

## 2019-08-19 MED ORDER — MORPHINE SULFATE (PF) 2 MG/ML IV SOLN
2.0000 mg | Freq: Once | INTRAVENOUS | Status: AC
  Administered 2019-08-19: 2 mg via INTRAVENOUS
  Filled 2019-08-19: qty 1

## 2019-08-19 NOTE — Progress Notes (Signed)
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  Patient started having symptoms of fever and cough at around Thanksgiving, 11/26, tested positive about 1 week ago.  He started developing more shortness of breath, on 12/6 was seen in the emergency room, no hypoxia on ambulation, sent home with azithromycin.  Came back with worsening shortness of breath and hypoxia.  In the emergency room, his saturations were 60% and he started 100% nonrebreather.  Chest x-ray showed multifocal pneumonia.  Admit to the hospital with multifocal pneumonia due to COVID-19 with worsening hypoxemia. 08/18/2019: Remains hypoxemic on 100% nonrebreather.  Assessment & Plan:   Active Problems:   Pneumonia due to COVID-19 virus   Essential hypertension   Hypothyroidism   HLD (hyperlipidemia)   BPH (benign prostatic hyperplasia)  Pneumonia due to COVID-19 virus, acute respiratory disease due to COVID-19 with acute hypoxemic respiratory failure: Continue to monitor due to significant symptoms  chest physiotherapy, incentive spirometry, deep breathing exercises, sputum induction, mucolytic's and bronchodilators. Supplemental oxygen to keep saturations more than 89%. Covid directed therapy with , steroids dexamethasone remdesivir day 3/5 actemra dose on 12/8, repeat dose on 12/9 convalescent plasma ,12/9, 1 unit received.  antibiotics not indicated.  Will check procalcitonin in the morning. Due to severity of symptoms, patient will need daily inflammatory markers, chest x-rays, liver function test to monitor and direct COVID-19 therapies. On Lovenox subcu for prophylactic dose.  Hypertension: Blood pressure stable.  He is on amlodipine and Terazosin.  Resume amlodipine.   Hyperlipidemia: On a statin at home.  Continue.  BPH: On Flomax that we will  continue.  Abdominal binder to help with coughing with his umbilical hernia.   DVT prophylaxis: Lovenox subcu Code Status: Full code Family Communication: Patient's wife, Dr. Evette Cristal Disposition Plan: Critically ill, remains in the hospital   Consultants:   None  Procedures:   None  Antimicrobials:   Remdesivir, 08/17/2019---   Subjective: Seen and examined.  Overnight events remain.  Oxygen remained fluctuating, mostly remained on either 15 L high flow nasal cannula oxygen or on nonrebreather. Patient complained of epigastric and periumbilical pain mostly musculoskeletal due to ongoing cough.  Still has wet cough, less streaky hemoptysis today. He feels if he has less pain, he may breathe better.  Objective: Vitals:   08/19/19 0008 08/19/19 0108 08/19/19 0418 08/19/19 0748  BP: (!) 134/57  139/75 134/85  Pulse:   (!) 56 (!) 57  Resp:   (!) 34 (!) 30  Temp: 98.5 F (36.9 C) 98.5 F (36.9 C) 98.3 F (36.8 C)   TempSrc: Oral Oral Oral   SpO2:   93% 92%  Weight:      Height:        Intake/Output Summary (Last 24 hours) at 08/19/2019 1109 Last data filed at 08/19/2019 0108 Gross per 24 hour  Intake 986 ml  Output -  Net 986 ml   Filed Weights   08/17/19 0939  Weight: 72.6 kg    Examination:  General exam: Anxious, in moderate distress, able to communicate in short sentences on nonrebreather.  Sick looking.Marland Kitchen Respiratory system: Poor bilateral air entry.  No added sounds.  On 15 L nonrebreather. Cardiovascular system: S1 & S2 heard, RRR. No JVD, murmurs, rubs, gallops or clicks. No pedal edema. Gastrointestinal system: Abdomen is nondistended, soft and nontender. No organomegaly or  masses felt. Normal bowel sounds heard. Central nervous system: Alert and oriented. No focal neurological deficits. Extremities: Symmetric 5 x 5 power. Skin: No rashes, lesions or ulcers Psychiatry: Judgement and insight appear normal. Mood & affect anxious.    Data Reviewed: I  have personally reviewed following labs and imaging studies  CBC: Recent Labs  Lab 08/15/19 1740 08/17/19 0949 08/17/19 1156 08/18/19 0158 08/19/19 0158  WBC 9.9 12.5*  --  8.6 7.5  NEUTROABS 8.7* 11.6*  --   --   --   HGB 12.3* 11.3* 11.2* 11.0* 11.1*  HCT 36.5* 33.4* 33.0* 32.8* 33.1*  MCV 89.5 90.3  --  89.4 88.7  PLT 256 279  --  289 [id number]   Basic Metabolic Panel: Recent Labs  Lab 08/15/19 1740 08/17/19 0949 08/17/19 1156 08/18/19 0158 08/19/19 0158  NA 137 139 139 139 141  K 3.3* 3.9 3.6 3.9 3.6  CL 105 110  --  110 109  CO2 21* 20*  --  22 22  GLUCOSE 101* 118*  --  128* 146*  BUN 20 18  --  27* 31*  CREATININE 0.80 0.72  --  0.50* 0.56*  CALCIUM 8.0* 8.1*  --  7.8* 7.9*   GFR: Estimated Creatinine Clearance: 74.2 mL/min (A) (by C-G formula based on SCr of 0.56 mg/dL (L)). Liver Function Tests: Recent Labs  Lab 08/15/19 1740 08/17/19 0949 08/18/19 0158 08/19/19 0158  AST 38 31 53* 53*  ALT 43 30 45* 56*  ALKPHOS 45 40 41 42  BILITOT 0.2* 0.6 0.8 0.5  PROT 6.5 6.3* 6.1* 5.9*  ALBUMIN 3.0* 2.5* 2.4* 2.3*   No results for input(s): LIPASE, AMYLASE in the last 168 hours. No results for input(s): AMMONIA in the last 168 hours. Coagulation Profile: No results for input(s): INR, PROTIME in the last 168 hours. Cardiac Enzymes: No results for input(s): CKTOTAL, CKMB, CKMBINDEX, TROPONINI in the last 168 hours. BNP (last 3 results) No results for input(s): PROBNP in the last 8760 hours. HbA1C: No results for input(s): HGBA1C in the last 72 hours. CBG: Recent Labs  Lab 08/18/19 1645 08/18/19 2118  GLUCAP 134* 126*   Lipid Profile: No results for input(s): CHOL, HDL, LDLCALC, TRIG, CHOLHDL, LDLDIRECT in the last 72 hours. Thyroid Function Tests: No results for input(s): TSH, T4TOTAL, FREET4, T3FREE, THYROIDAB in the last 72 hours. Anemia Panel: Recent Labs    08/17/19 1345  FERRITIN 251   Sepsis Labs: Recent Labs  Lab 08/15/19 1740 08/17/19  0949 08/17/19 1145 08/17/19 1345  PROCALCITON  --   --   --  <0.10  LATICACIDVEN 1.6 2.1* 2.2*  --     Recent Results (from the past 240 hour(s))  Blood culture (routine x 2)     Status: None (Preliminary result)   Collection Time: 08/15/19  5:40 PM   Specimen: BLOOD  Result Value Ref Range Status   Specimen Description BLOOD LEFT ANTECUBITAL  Final   Special Requests   Final    BOTTLES DRAWN AEROBIC AND ANAEROBIC Blood Culture results may not be optimal due to an inadequate volume of blood received in culture bottles   Culture   Final    NO GROWTH 4 DAYS Performed at [facility] [address], [place], [place] [address]    Report Status PENDING  Incomplete  Blood culture (routine x 2)     Status: None (Preliminary result)   Collection Time: 08/15/19  5:42 PM   Specimen: BLOOD  Result  Value Ref Range Status   Specimen Description BLOOD RIGHT ANTECUBITAL  Final   Special Requests   Final    BOTTLES DRAWN AEROBIC AND ANAEROBIC Blood Culture results may not be optimal due to an excessive volume of blood received in culture bottles   Culture   Final    NO GROWTH 4 DAYS Performed at [facility] [address], [place], [place] [address]    Report Status PENDING  Incomplete  Blood culture (routine x 2)     Status: None (Preliminary result)   Collection Time: 08/17/19 11:36 AM   Specimen: BLOOD  Result Value Ref Range Status   Specimen Description BLOOD LEFT ANTECUBITAL  Final   Special Requests   Final    BOTTLES DRAWN AEROBIC AND ANAEROBIC Blood Culture adequate volume   Culture   Final    NO GROWTH 2 DAYS Performed at [facility], [place] [address], [place], [place] [address]    Report Status PENDING  Incomplete  Blood culture (routine x 2)     Status: None (Preliminary result)   Collection Time: 08/17/19 12:04 PM   Specimen: BLOOD RIGHT HAND  Result Value Ref Range Status   Specimen Description BLOOD RIGHT HAND  Final   Special Requests   Final     BOTTLES DRAWN AEROBIC ONLY Blood Culture results may not be optimal due to an inadequate volume of blood received in culture bottles   Culture   Final    NO GROWTH 2 DAYS Performed at [facility], [place] [address] [place], [place] [address]    Report Status PENDING  Incomplete         Radiology Studies: DG CHEST PORT 1 VIEW  Result Date: 08/18/2019 CLINICAL DATA:  Pneumonia. EXAM: PORTABLE CHEST 1 VIEW COMPARISON:  08/17/2019 and CT chest 08/15/2019. FINDINGS: Trachea is midline. Heart size stable. Fairly diffuse bilateral airspace opacification, with slight sparing of the left upper lobe. Probable tiny bilateral pleural effusions. No pneumothorax. Findings are similar to 08/17/2019. IMPRESSION: 1. Fairly diffuse bilateral airspace disease with relative sparing of the left upper lobe, similar to yesterday's exam and in keeping with COVID pneumonia. 2. Probable tiny bilateral pleural effusions. Electronically Signed   By: Lorin Picket M.D.   On: 08/18/2019 10:19   DG Chest Portable 1 View  Result Date: 08/17/2019 CLINICAL DATA:  covid positive, hypoxia, cough, on abx for PNAcovid positive, hypoxia, cough, on abx for PNA EXAM: PORTABLE CHEST 1 VIEW COMPARISON:  08/13/2019 FINDINGS: There are patchy parenchymal infiltrates bilaterally, significantly increased over the prior study and involving the RIGHT lung greater than the LEFT. Heart size is normal. No pulmonary edema. IMPRESSION: Significantly increased bilateral pulmonary infiltrates, RIGHT greater than LEFT. Electronically Signed   By: Nolon Nations M.D.   On: 08/17/2019 11:29        Scheduled Meds: . sodium chloride   Intravenous Once  . albuterol  2 puff Inhalation Q6H  . amLODipine  5 mg Oral Daily  . dexamethasone (DECADRON) injection  10 mg Intravenous Q24H  . enoxaparin (LOVENOX) injection  40 mg Subcutaneous Q24H  . feeding supplement (ENSURE ENLIVE)  237 mL Oral BID BM  . finasteride  5 mg Oral Daily  .  rosuvastatin  10 mg Oral q1800  . tamsulosin  0.4 mg Oral Daily   Continuous Infusions: . famotidine (PEPCID) IV 20 mg (08/19/19 0924)  . remdesivir 100 mg in NS 100 mL 100 mg (08/18/19 0831)  LOS: 2 days    Time spent: 30 minutes    Barb Merino, MD [facility] Pager [phone]

## 2019-08-19 NOTE — Progress Notes (Signed)
   08/19/19 0748  Family/Significant Other Communication  Family/Significant Other Update Called;Updated   Wife called and update given with husband present. Went over [place] with pt and wife. All questions asked and all questions answered.

## 2019-08-20 ENCOUNTER — Inpatient Hospital Stay (HOSPITAL_COMMUNITY): Payer: PPO

## 2019-08-20 DIAGNOSIS — J9601 Acute respiratory failure with hypoxia: Secondary | ICD-10-CM

## 2019-08-20 DIAGNOSIS — E785 Hyperlipidemia, unspecified: Secondary | ICD-10-CM

## 2019-08-20 DIAGNOSIS — U071 COVID-19: Principal | ICD-10-CM

## 2019-08-20 DIAGNOSIS — J1289 Other viral pneumonia: Secondary | ICD-10-CM

## 2019-08-20 DIAGNOSIS — N400 Enlarged prostate without lower urinary tract symptoms: Secondary | ICD-10-CM

## 2019-08-20 LAB — COMPREHENSIVE METABOLIC PANEL
ALT: 66 U/L — ABNORMAL HIGH (ref 0–44)
AST: 52 U/L — ABNORMAL HIGH (ref 15–41)
Albumin: 2.4 g/dL — ABNORMAL LOW (ref 3.5–5.0)
Alkaline Phosphatase: 52 U/L (ref 38–126)
Anion gap: 10 (ref 5–15)
BUN: 30 mg/dL — ABNORMAL HIGH (ref 8–23)
CO2: 23 mmol/L (ref 22–32)
Calcium: 7.8 mg/dL — ABNORMAL LOW (ref 8.9–10.3)
Chloride: 107 mmol/L (ref 98–111)
Creatinine, Ser: 0.63 mg/dL (ref 0.61–1.24)
GFR calc Af Amer: >60 mL/min (ref >60)
GFR calc non Af Amer: >60 mL/min (ref >60)
Glucose, Bld: 114 mg/dL — ABNORMAL HIGH (ref 70–99)
Potassium: 3.9 mmol/L (ref 3.5–5.1)
Sodium: 140 mmol/L (ref 135–145)
Total Bilirubin: 0.6 mg/dL (ref 0.3–1.2)
Total Protein: 5.8 g/dL — ABNORMAL LOW (ref 6.5–8.1)

## 2019-08-20 LAB — C-REACTIVE PROTEIN: CRP: 4 mg/dL — ABNORMAL HIGH (ref <1.0)

## 2019-08-20 LAB — GLUCOSE, CAPILLARY
Glucose-Capillary: 105 mg/dL — ABNORMAL HIGH (ref 70–99)
Glucose-Capillary: 137 mg/dL — ABNORMAL HIGH (ref 70–99)
Glucose-Capillary: 126 mg/dL — ABNORMAL HIGH (ref 70–99)

## 2019-08-20 LAB — CULTURE, BLOOD (ROUTINE X 2)
Culture: NO GROWTH
Culture: NO GROWTH

## 2019-08-20 LAB — CBC
HCT: 33.8 % — ABNORMAL LOW (ref 39.0–52.0)
Hemoglobin: 11.3 g/dL — ABNORMAL LOW (ref 13.0–17.0)
MCH: 29.7 pg (ref 26.0–34.0)
MCHC: 33.4 g/dL (ref 30.0–36.0)
MCV: 88.9 fL (ref 80.0–100.0)
Platelets: 353 10*3/uL (ref 150–400)
RBC: 3.8 MIL/uL — ABNORMAL LOW (ref 4.22–5.81)
RDW: 14.1 % (ref 11.5–15.5)
WBC: 7.8 10*3/uL (ref 4.0–10.5)
nRBC: 0 % (ref 0.0–0.2)

## 2019-08-20 LAB — TSH: TSH: 1.33 u[IU]/mL (ref 0.350–4.500)

## 2019-08-20 LAB — PROCALCITONIN: Procalcitonin: <0.1 ng/mL

## 2019-08-20 LAB — D-DIMER, QUANTITATIVE: D-Dimer, Quant: >20 ug/mL-FEU — ABNORMAL HIGH (ref 0.00–0.50)

## 2019-08-20 LAB — FERRITIN: Ferritin: 335 ng/mL (ref 24–336)

## 2019-08-20 MED ORDER — FUROSEMIDE 10 MG/ML IJ SOLN
40.0000 mg | Freq: Once | INTRAMUSCULAR | Status: AC
  Administered 2019-08-20: 40 mg via INTRAVENOUS
  Filled 2019-08-20: qty 4

## 2019-08-20 MED ORDER — ENOXAPARIN SODIUM 80 MG/0.8ML ~~LOC~~ SOLN
70.0000 mg | Freq: Two times a day (BID) | SUBCUTANEOUS | Status: DC
  Administered 2019-08-20 – 2019-08-26 (×14): 70 mg via SUBCUTANEOUS
  Filled 2019-08-20 (×15): qty 0.8

## 2019-08-20 MED ORDER — MORPHINE SULFATE (PF) 2 MG/ML IV SOLN
2.0000 mg | Freq: Four times a day (QID) | INTRAVENOUS | Status: DC | PRN
  Filled 2019-08-20: qty 1

## 2019-08-20 MED ORDER — FAMOTIDINE 20 MG PO TABS
20.0000 mg | ORAL_TABLET | Freq: Two times a day (BID) | ORAL | Status: DC
  Administered 2019-08-20 – 2019-08-26 (×8): 20 mg via ORAL
  Filled 2019-08-20 (×12): qty 1

## 2019-08-20 MED ORDER — GUAIFENESIN-CODEINE 100-10 MG/5ML PO SOLN
10.0000 mL | Freq: Four times a day (QID) | ORAL | Status: DC | PRN
  Administered 2019-08-20 (×2): 10 mL via ORAL
  Filled 2019-08-20 (×2): qty 10

## 2019-08-20 MED ORDER — FUROSEMIDE 10 MG/ML IJ SOLN
40.0000 mg | Freq: Once | INTRAMUSCULAR | Status: AC
  Administered 2019-08-20: 12:00:00 40 mg via INTRAVENOUS
  Filled 2019-08-20: qty 4

## 2019-08-20 MED ORDER — IOHEXOL 350 MG/ML SOLN
75.0000 mL | Freq: Once | INTRAVENOUS | Status: AC | PRN
  Administered 2019-08-20: 13:00:00 75 mL via INTRAVENOUS

## 2019-08-20 MED ORDER — MORPHINE SULFATE (PF) 2 MG/ML IV SOLN
2.0000 mg | Freq: Three times a day (TID) | INTRAVENOUS | Status: DC | PRN
  Administered 2019-08-20: 13:00:00 2 mg via INTRAVENOUS
  Filled 2019-08-20: qty 1

## 2019-08-20 MED ORDER — LEVOTHYROXINE SODIUM 75 MCG PO TABS
150.0000 ug | ORAL_TABLET | Freq: Every day | ORAL | Status: DC
  Administered 2019-08-20 – 2019-08-24 (×5): 150 ug via ORAL
  Filled 2019-08-20 (×5): qty 2

## 2019-08-20 NOTE — Progress Notes (Signed)
ANTICOAGULATION CONSULT NOTE - Initial Consult  Pharmacy Consult for Lovenox Indication: r/o VTE with d-dimer > 20  No Known Allergies  Patient Measurements: Height: 5\' 6"  (167.6 cm) Weight: 160 lb (72.6 kg) IBW/kg (Calculated) : 63.8  Vital Signs: Temp: 97.6 F (36.4 C) (12/10 2300) Temp Source: Axillary (12/10 2300) BP: 131/71 (12/10 2300) Pulse Rate: 64 (12/10 2300)  Labs: Recent Labs    08/17/19 0949 08/17/19 0949 08/17/19 1345 08/18/19 0158 08/19/19 0158 08/20/19 0110  HGB 11.3*   < >  --  11.0* 11.1* 11.3*  HCT 33.4*   < >  --  32.8* 33.1* 33.8*  PLT 279  --   --  289 366 353  CREATININE 0.72  --   --  0.50* 0.56* 0.63  TROPONINIHS 13  --  17  --   --   --    < > = values in this interval not displayed.    Estimated Creatinine Clearance: 74.2 mL/min (by C-G formula based on SCr of 0.63 mg/dL).   Medical History: Past Medical History:  Diagnosis Date  . BPH (benign prostatic hyperplasia)   . High cholesterol   . Hypertension   . Hypothyroidism     Medications:  Scheduled:  . sodium chloride   Intravenous Once  . albuterol  2 puff Inhalation Q6H  . amLODipine  5 mg Oral Daily  . dexamethasone (DECADRON) injection  10 mg Intravenous Q24H  . enoxaparin (LOVENOX) injection  40 mg Subcutaneous Q24H  . feeding supplement (ENSURE ENLIVE)  237 mL Oral BID BM  . finasteride  5 mg Oral Daily  . rosuvastatin  10 mg Oral q1800  . senna-docusate  1 tablet Oral BID  . tamsulosin  0.4 mg Oral Daily    Assessment: 73 y.o. M presents to hospital with COVID 19. Pt currently on Lovenox 40mg  daily VTE prophylactic dose - last dose 12/10 1145. D-dimer now >20 and MD consulted pharmacy to dose Lovenox for VTE treatment. CBC stable.  Goal of Therapy:  Anti-Xa level 0.6-1 units/ml 4hrs after LMWH dose given Monitor platelets by anticoagulation protocol: Yes   Plan:  Lovenox 70 mg SQ q12h CBC q72h while pt on full-dose Lovenox  Sherlon Handing, PharmD, BCPS Please  see amion for complete clinical pharmacist phone list 08/20/2019,6:31 AM

## 2019-08-20 NOTE — Progress Notes (Signed)
PHARMACIST - PHYSICIAN COMMUNICATION  DR:   Sloan Leiter  CONCERNING: IV to Oral Route Change Policy  RECOMMENDATION: This patient is receiving pepcid by the intravenous route.  Based on criteria approved by the Pharmacy and Therapeutics Committee, the intravenous medication(s) is/are being converted to the equivalent oral dose form(s).   DESCRIPTION: These criteria include:  The patient is eating (either orally or via tube) and/or has been taking other orally administered medications for a least 24 hours  The patient has no evidence of active gastrointestinal bleeding or impaired GI absorption (gastrectomy, short bowel, patient on TNA or NPO).  If you have questions about this conversion, please contact the [place], PharmD, [place]: [phone] 08/20/2019 12:31 PM

## 2019-08-20 NOTE — Progress Notes (Signed)
   08/20/19 1100  Family/Significant Other Communication  Family/Significant Other Update Called;Updated  Called wife and gave her an update. Was unable to get her in the am after she called and couldn't answer the phone. Needs met now though.

## 2019-08-20 NOTE — Consult Note (Signed)
NAME:  David Villegas, MRN:  [id number], DOB:  May 12, 1946, LOS: 3 ADMISSION DATE:  08/17/2019, CONSULTATION DATE:  12/11 REFERRING MD:  Sloan Leiter CHIEF COMPLAINT:  dyspnea   Brief History   73 y/o male admitted on 12/8 with severe acute respiratory failure with hypoxemia from COVID 19 pneumonia.  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  Patient has a history of hypertension but no history of underlying cardiac disease.  He says that he started to become ill around Thanksgiving.  He has had progression of cough, congestion, and shortness of breath.  Since being admitted to the hospital he feels better.  He says that he did not like laying on his stomach, this made him feel more short of breath.  He says that he has not had much leg swelling.  He has been taking the Lasix.  This made him urinate quite a bit this morning.  He says that he feels a lot of chest congestion still and is unable to clear all the secretions from his chest. PCCM was consulted because his oxygen needs have increased as has his dyspnea.   Past Medical History  Hypertension Hypothyroidism Hyperlipidemia BPH  Significant Hospital Events   12/8 admission  Consults:  PCCM  Procedures:  none  Significant Diagnostic Tests:  12/11 CT angiogram chest > images independently reviewed, no pulmonary ballismus seen, severe bilateral airspace disease with sparing of the left upper lobe  Micro Data:  12/8 blood >   Antimicrobials:  12/8 decadron >  12/8 remdesivir >  12/8 tocilizumab (also received on 12/9) >   Interim history/subjective:   As above  Objective   Blood pressure 130/74, pulse 63, temperature (!) 97.1 F (36.2 C), temperature source Oral, resp. rate (!) 28, height 5\' 6"  (1.676 m), weight 72.6 kg, SpO2 91 %.    FiO2 (%):  [100 %] 100 %   Intake/Output  Summary (Last 24 hours) at 08/20/2019 1651 Last data filed at 08/20/2019 1101 Gross per 24 hour  Intake 1000.62 ml  Output 401 ml  Net 599.62 ml   Filed Weights   08/17/19 0939  Weight: 72.6 kg    Examination:  General:  Resting comfortably in bed HENT: NCAT OP clear PULM: Rhonchi and course crackles bases B, normal effort CV: RRR, no mgr, JVD elevated GI: BS+, soft, nontender MSK: normal bulk and tone Neuro: awake, alert, no distress, MAEW   Resolved Hospital Problem list     Assessment & Plan:  Severe acute respiratory failure with hypoxemia due to COVID 19 pneumonia: currently resting comfortably, doesn't need ICU level care or heated high flow but could deteriorate over the next few days; I think there may be a component of volume overload given R sided pleural effusion, physical exam and elevated JVD > agree with lasix > agree with steroids/remdesivir as you are doing > I encouraged flutter/incentive spirometry, out of bed Tolerate periods of hypoxemia, goal at rest is greater than 85% SaO2, with movement ideally above 75% Decision for intubation should be based on a change in mental status or physical evidence of ventilatory failure such as nasal flaring, accessory muscle use, paradoxical breathing Out of bed to chair as able Incentive spirometry is important, use every hour Prone positioning while in bed   Best practice:   Per [facility]  Labs   CBC: Recent Labs  Lab 08/15/19  1740 08/17/19 0949 08/17/19 1156 08/18/19 0158 08/19/19 0158 08/20/19 0110  WBC 9.9 12.5*  --  8.6 7.5 7.8  NEUTROABS 8.7* 11.6*  --   --   --   --   HGB 12.3* 11.3* 11.2* 11.0* 11.1* 11.3*  HCT 36.5* 33.4* 33.0* 32.8* 33.1* 33.8*  MCV 89.5 90.3  --  89.4 88.7 88.9  PLT 256 279  --  289 366 0000000    Basic Metabolic Panel: Recent Labs  Lab 08/15/19 1740 08/17/19 0949 08/17/19 1156 08/18/19 0158 08/19/19 0158 08/20/19 0110  NA 137 139 139 139 141 140  K 3.3* 3.9 3.6 3.9 3.6 3.9    CL 105 110  --  110 109 107  CO2 21* 20*  --  22 22 23   GLUCOSE 101* 118*  --  128* 146* 114*  BUN 20 18  --  27* 31* 30*  CREATININE 0.80 0.72  --  0.50* 0.56* 0.63  CALCIUM 8.0* 8.1*  --  7.8* 7.9* 7.8*   GFR: Estimated Creatinine Clearance: 74.2 mL/min (by C-G formula based on SCr of 0.63 mg/dL). Recent Labs  Lab 08/15/19 1740 08/17/19 0949 08/17/19 1145 08/17/19 1345 08/18/19 0158 08/19/19 0158 08/20/19 0110  PROCALCITON  --   --   --  <0.10  --   --  <0.10  WBC 9.9 12.5*  --   --  8.6 7.5 7.8  LATICACIDVEN 1.6 2.1* 2.2*  --   --   --   --     Liver Function Tests: Recent Labs  Lab 08/15/19 1740 08/17/19 0949 08/18/19 0158 08/19/19 0158 08/20/19 0110  AST 38 31 53* 53* 52*  ALT 43 30 45* 56* 66*  ALKPHOS 45 40 41 42 52  BILITOT 0.2* 0.6 0.8 0.5 0.6  PROT 6.5 6.3* 6.1* 5.9* 5.8*  ALBUMIN 3.0* 2.5* 2.4* 2.3* 2.4*   No results for input(s): LIPASE, AMYLASE in the last 168 hours. No results for input(s): AMMONIA in the last 168 hours.  ABG    Component Value Date/Time   PHART 7.432 08/17/2019 1156   PCO2ART 28.1 (L) 08/17/2019 1156   PO2ART 50.0 (L) 08/17/2019 1156   HCO3 18.5 (L) 08/17/2019 1156   TCO2 19 (L) 08/17/2019 1156   ACIDBASEDEF 4.0 (H) 08/17/2019 1156   O2SAT 85.0 08/17/2019 1156     Coagulation Profile: No results for input(s): INR, PROTIME in the last 168 hours.  Cardiac Enzymes: No results for input(s): CKTOTAL, CKMB, CKMBINDEX, TROPONINI in the last 168 hours.  HbA1C: No results found for: HGBA1C  CBG: Recent Labs  Lab 08/19/19 1200 08/19/19 1704 08/19/19 2104 08/20/19 0709 08/20/19 1145  GLUCAP 172* 178* 136* 105* 137*    Review of Systems:   Gen: Denies fever, chills, weight change, fatigue, night sweats HEENT: Denies blurred vision, double vision, hearing loss, tinnitus, sinus congestion, rhinorrhea, sore throat, neck stiffness, dysphagia PULM: per HPI CV: Denies chest pain, edema, orthopnea, paroxysmal nocturnal  dyspnea, palpitations GI: Denies abdominal pain, nausea, vomiting, diarrhea, hematochezia, melena, constipation, change in bowel habits GU: Denies dysuria, hematuria, polyuria, oliguria, urethral discharge Endocrine: Denies hot or cold intolerance, polyuria, polyphagia or appetite change Derm: Denies rash, dry skin, scaling or peeling skin change Heme: Denies easy bruising, bleeding, bleeding gums Neuro: Denies headache, numbness, weakness, slurred speech, loss of memory or consciousness   Past Medical History  He,  has a past medical history of BPH (benign prostatic hyperplasia), High cholesterol, Hypertension, and Hypothyroidism.   Surgical History  Past Surgical History:  Procedure Laterality Date  . MASTOIDECTOMY Left   . NASAL SEPTUM SURGERY    . TONSILECTOMY, ADENOIDECTOMY, BILATERAL MYRINGOTOMY AND TUBES       Social History   reports that he has never smoked. He has never used smokeless tobacco. He reports that he does not drink alcohol or use drugs.   Family History   His family history includes Heart attack in his father.   Allergies No Known Allergies   Home Medications  Prior to Admission medications   Medication Sig Start Date End Date Taking? Authorizing Provider  albuterol (VENTOLIN HFA) 108 (90 Base) MCG/ACT inhaler Inhale 2 puffs into the lungs every 6 (six) hours as needed for shortness of breath. 08/11/19  Yes [provider]  amLODipine (NORVASC) 5 MG tablet Take 5 mg by mouth daily. 07/20/19  Yes [provider]  cetirizine (ZYRTEC) 10 MG tablet Take 10 mg by mouth daily. 05/24/19  Yes [provider]  dexamethasone (DECADRON) 4 MG tablet Take 4 mg by mouth 2 (two) times daily. 08/13/19  Yes [provider]  doxycycline (VIBRA-TABS) 100 MG tablet Take 100 mg by mouth 2 (two) times daily. 08/11/19  Yes [provider]  finasteride (PROSCAR) 5 MG tablet Take 5 mg by mouth daily. 06/09/19  Yes [provider]    levothyroxine (SYNTHROID) 150 MCG tablet Take 150 mcg by mouth daily. 05/07/19  Yes [provider]  potassium chloride SA (KLOR-CON) 20 MEQ tablet Take 1 tablet (20 mEq total) by mouth daily. 08/15/19  Yes Petrucelli, Samantha R, PA-C  rosuvastatin (CRESTOR) 10 MG tablet Take 10 mg by mouth at bedtime. 05/07/19  Yes [provider]  tamsulosin (FLOMAX) 0.4 MG CAPS capsule Take 0.4 mg by mouth daily. 06/09/19  Yes [provider]  Vitamin D, Ergocalciferol, (DRISDOL) 1.25 MG (50000 UT) CAPS capsule Take 50,000 Units by mouth once a week. Monday or Tuesday 08/11/19  Yes [provider]  zolpidem (AMBIEN) 10 MG tablet Take 10 mg by mouth at bedtime as needed. 07/10/19  Yes [provider]     Critical care time: n/a     Roselie Awkward, MD [facility] Pager: [phone] Cell: [phone] If no response, call [phone]

## 2019-08-20 NOTE — Progress Notes (Signed)
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  Patient started having symptoms of fever and cough at around Thanksgiving, 11/26, tested positive about 1 week ago.  He started developing more shortness of breath, on 12/6 was seen in the emergency room, no hypoxia on ambulation, sent home with azithromycin.  Came back with worsening shortness of breath and hypoxia.  In the emergency room, his saturations were 60% and he started 100% nonrebreather.  Chest x-ray showed multifocal pneumonia.  Admited to the hospital with multifocal pneumonia due to COVID-19 with worsening hypoxemia. 08/20/2019: Remains persistently hypoxemic on 100% nonrebreather. D-dimer elevated more than 20.  Assessment & Plan:   Active Problems:   Pneumonia due to COVID-19 virus   Essential hypertension   Hypothyroidism   HLD (hyperlipidemia)   BPH (benign prostatic hyperplasia)  Pneumonia due to COVID-19 virus, acute respiratory disease due to COVID-19 with acute hypoxemic respiratory failure: Continue to monitor due to significant symptoms, still remains with very high oxygen demand. chest physiotherapy, incentive spirometry, deep breathing exercises, sputum induction, mucolytic's and bronchodilators. Supplemental oxygen to keep saturations more than 89%. Covid directed therapy with , steroids on dexamethasone remdesivir day 4/5 actemra dose on 12/8, repeat dose on 12/9 convalescent plasma ,12/9, 1 unit received.  antibiotics not indicated.  Procalcitonin normal. D-dimer elevated more than 20, starting on therapeutic Lovenox. Check CTA of the chest and lower extremity duplexes. Due to severity of symptoms, patient will need daily inflammatory markers, liver function test to monitor and direct COVID-19 therapies. We will try a dose of Lasix  today.  Hypertension: Blood pressure stable.  He is on amlodipine and Terazosin.  Resume amlodipine.   Hyperlipidemia: On a statin at home.  Continue.  BPH: On Flomax that we will continue.  Hypothyroidism: Resume home thyroxine.  Add TSH in the morning labs.  Abdominal binder to help with coughing with his umbilical hernia.   DVT prophylaxis: Lovenox sq, increased to therapeutic dose today 08/20/2019 Code Status: Full code Family Communication: Patient's wife, Dr. Evette Cristal Disposition Plan: Critically ill, remains in the hospital   Consultants:   None  Procedures:   None  Antimicrobials:   Remdesivir, 08/17/2019---   Subjective: Patient seen and examined.  Still remains on very high flow oxygen with difficulty ambulation.  Afebrile.  Complains of itchiness in the throat and wanting some additional cough suppressant. Cannot lie on side or lie prone. Upper abdominal pain mostly with coughing.  Objective: Vitals:   08/19/19 2300 08/20/19 0329 08/20/19 0400 08/20/19 0736  BP: 131/71 138/72 138/72 130/74  Pulse: 64 61 70 63  Resp: (!) 23 (!) 26 20 (!) 28  Temp: 97.6 F (36.4 C)  (!) 97.5 F (36.4 C) (!) 97.1 F (36.2 C)  TempSrc: Axillary  Axillary Oral  SpO2: 92% (!) 87%  (!) 88%  Weight:      Height:        Intake/Output Summary (Last 24 hours) at 08/20/2019 1051 Last data filed at 08/20/2019 0700 Gross per 24 hour  Intake 1250.62 ml  Output 401 ml  Net 849.62 ml   Filed Weights   08/17/19 0939  Weight: 72.6 kg    Examination:  General exam: Anxious,  in moderate distress, able to communicate. Sick looking. Respiratory system: Poor bilateral air entry. Few crackles. On 15 L nonrebreather. Cardiovascular system: S1 &  S2 heard, RRR. No JVD, murmurs, rubs, gallops or clicks. No pedal edema. Gastrointestinal system: Abdomen is nondistended, soft and nontender. No organomegaly or masses felt. Normal bowel sounds heard. Central nervous system: Alert and  oriented. No focal neurological deficits. Extremities: Symmetric 5 x 5 power. Skin: No rashes, lesions or ulcers Psychiatry: Judgement and insight appear normal. Mood & affect anxious. Peripheral pulses are adequate.    Data Reviewed: I have personally reviewed following labs and imaging studies  CBC: Recent Labs  Lab 08/15/19 1740 08/17/19 0949 08/17/19 1156 08/18/19 0158 08/19/19 0158 08/20/19 0110  WBC 9.9 12.5*  --  8.6 7.5 7.8  NEUTROABS 8.7* 11.6*  --   --   --   --   HGB 12.3* 11.3* 11.2* 11.0* 11.1* 11.3*  HCT 36.5* 33.4* 33.0* 32.8* 33.1* 33.8*  MCV 89.5 90.3  --  89.4 88.7 88.9  PLT 256 279  --  289 366 0000000   Basic Metabolic Panel: Recent Labs  Lab 08/15/19 1740 08/17/19 0949 08/17/19 1156 08/18/19 0158 08/19/19 0158 08/20/19 0110  NA 137 139 139 139 141 140  K 3.3* 3.9 3.6 3.9 3.6 3.9  CL 105 110  --  110 109 107  CO2 21* 20*  --  22 22 23   GLUCOSE 101* 118*  --  128* 146* 114*  BUN 20 18  --  27* 31* 30*  CREATININE 0.80 0.72  --  0.50* 0.56* 0.63  CALCIUM 8.0* 8.1*  --  7.8* 7.9* 7.8*   GFR: Estimated Creatinine Clearance: 74.2 mL/min (by C-G formula based on SCr of 0.63 mg/dL). Liver Function Tests: Recent Labs  Lab 08/15/19 1740 08/17/19 0949 08/18/19 0158 08/19/19 0158 08/20/19 0110  AST 38 31 53* 53* 52*  ALT 43 30 45* 56* 66*  ALKPHOS 45 40 41 42 52  BILITOT 0.2* 0.6 0.8 0.5 0.6  PROT 6.5 6.3* 6.1* 5.9* 5.8*  ALBUMIN 3.0* 2.5* 2.4* 2.3* 2.4*   No results for input(s): LIPASE, AMYLASE in the last 168 hours. No results for input(s): AMMONIA in the last 168 hours. Coagulation Profile: No results for input(s): INR, PROTIME in the last 168 hours. Cardiac Enzymes: No results for input(s): CKTOTAL, CKMB, CKMBINDEX, TROPONINI in the last 168 hours. BNP (last 3 results) No results for input(s): PROBNP in the last 8760 hours. HbA1C: No results for input(s): HGBA1C in the last 72 hours. CBG: Recent Labs  Lab 08/18/19 2118 08/19/19 1200  08/19/19 1704 08/19/19 2104 08/20/19 0709  GLUCAP 126* 172* 178* 136* 105*   Lipid Profile: No results for input(s): CHOL, HDL, LDLCALC, TRIG, CHOLHDL, LDLDIRECT in the last 72 hours. Thyroid Function Tests: No results for input(s): TSH, T4TOTAL, FREET4, T3FREE, THYROIDAB in the last 72 hours. Anemia Panel: Recent Labs    08/17/19 1345 08/20/19 0110  FERRITIN 251 335   Sepsis Labs: Recent Labs  Lab 08/15/19 1740 08/17/19 0949 08/17/19 1145 08/17/19 1345 08/20/19 0110  PROCALCITON  --   --   --  <0.10 <0.10  LATICACIDVEN 1.6 2.1* 2.2*  --   --     Recent Results (from the past 240 hour(s))  Blood culture (routine x 2)     Status: None (Preliminary result)   Collection Time: 08/15/19  5:40 PM   Specimen: BLOOD  Result Value Ref Range Status   Specimen Description BLOOD LEFT ANTECUBITAL  Final   Special Requests   Final    BOTTLES DRAWN AEROBIC AND ANAEROBIC Blood Culture results may not be optimal due  to an inadequate volume of blood received in culture bottles   Culture   Final    NO GROWTH 4 DAYS Performed at [facility], [place] [address], [place], [place] [address]    Report Status PENDING  Incomplete  Blood culture (routine x 2)     Status: None (Preliminary result)   Collection Time: 08/15/19  5:42 PM   Specimen: BLOOD  Result Value Ref Range Status   Specimen Description BLOOD RIGHT ANTECUBITAL  Final   Special Requests   Final    BOTTLES DRAWN AEROBIC AND ANAEROBIC Blood Culture results may not be optimal due to an excessive volume of blood received in culture bottles   Culture   Final    NO GROWTH 4 DAYS Performed at [facility], [place] [address] [place], [place] [address]    Report Status PENDING  Incomplete  Blood culture (routine x 2)     Status: None (Preliminary result)   Collection Time: 08/17/19 11:36 AM   Specimen: BLOOD  Result Value Ref Range Status   Specimen Description BLOOD LEFT ANTECUBITAL  Final   Special Requests   Final     BOTTLES DRAWN AEROBIC AND ANAEROBIC Blood Culture adequate volume   Culture   Final    NO GROWTH 2 DAYS Performed at [facility], [place] [address], [place], [place] [address]    Report Status PENDING  Incomplete  Blood culture (routine x 2)     Status: None (Preliminary result)   Collection Time: 08/17/19 12:04 PM   Specimen: BLOOD RIGHT HAND  Result Value Ref Range Status   Specimen Description BLOOD RIGHT HAND  Final   Special Requests   Final    BOTTLES DRAWN AEROBIC ONLY Blood Culture results may not be optimal due to an inadequate volume of blood received in culture bottles   Culture   Final    NO GROWTH 2 DAYS Performed at [facility], [place] [address] [place], [place] [address]    Report Status PENDING  Incomplete         Radiology Studies: No results found.      Scheduled Meds: . sodium chloride   Intravenous Once  . albuterol  2 puff Inhalation Q6H  . amLODipine  5 mg Oral Daily  . dexamethasone (DECADRON) injection  10 mg Intravenous Q24H  . enoxaparin (LOVENOX) injection  70 mg Subcutaneous Q12H  . feeding supplement (ENSURE ENLIVE)  237 mL Oral BID BM  . finasteride  5 mg Oral Daily  . levothyroxine  150 mcg Oral Q0600  . rosuvastatin  10 mg Oral q1800  . senna-docusate  1 tablet Oral BID  . tamsulosin  0.4 mg Oral Daily   Continuous Infusions: . famotidine (PEPCID) IV 20 mg (08/20/19 0958)  . remdesivir 100 mg in NS 100 mL 100 mg (08/19/19 1138)     LOS: 3 days    Time spent: 30 minutes    Barb Merino, MD [facility] Pager [phone]

## 2019-08-21 LAB — COMPREHENSIVE METABOLIC PANEL
ALT: 85 U/L — ABNORMAL HIGH (ref 0–44)
AST: 58 U/L — ABNORMAL HIGH (ref 15–41)
Albumin: 2.7 g/dL — ABNORMAL LOW (ref 3.5–5.0)
Alkaline Phosphatase: 57 U/L (ref 38–126)
Anion gap: 13 (ref 5–15)
BUN: 29 mg/dL — ABNORMAL HIGH (ref 8–23)
CO2: 24 mmol/L (ref 22–32)
Calcium: 7.8 mg/dL — ABNORMAL LOW (ref 8.9–10.3)
Chloride: 102 mmol/L (ref 98–111)
Creatinine, Ser: 0.7 mg/dL (ref 0.61–1.24)
GFR calc Af Amer: >60 mL/min (ref >60)
GFR calc non Af Amer: >60 mL/min (ref >60)
Glucose, Bld: 111 mg/dL — ABNORMAL HIGH (ref 70–99)
Potassium: 3.7 mmol/L (ref 3.5–5.1)
Sodium: 139 mmol/L (ref 135–145)
Total Bilirubin: 1 mg/dL (ref 0.3–1.2)
Total Protein: 6.2 g/dL — ABNORMAL LOW (ref 6.5–8.1)

## 2019-08-21 LAB — CBC
HCT: 38.2 % — ABNORMAL LOW (ref 39.0–52.0)
Hemoglobin: 12.9 g/dL — ABNORMAL LOW (ref 13.0–17.0)
MCH: 29.9 pg (ref 26.0–34.0)
MCHC: 33.8 g/dL (ref 30.0–36.0)
MCV: 88.4 fL (ref 80.0–100.0)
Platelets: 387 10*3/uL (ref 150–400)
RBC: 4.32 MIL/uL (ref 4.22–5.81)
RDW: 13.7 % (ref 11.5–15.5)
WBC: 9.6 10*3/uL (ref 4.0–10.5)
nRBC: 0.2 % (ref 0.0–0.2)

## 2019-08-21 LAB — PROCALCITONIN: Procalcitonin: <0.1 ng/mL

## 2019-08-21 LAB — MAGNESIUM
Magnesium: 2.3 mg/dL (ref 1.7–2.4)
Magnesium: 2.2 mg/dL (ref 1.7–2.4)

## 2019-08-21 LAB — GLUCOSE, CAPILLARY
Glucose-Capillary: 93 mg/dL (ref 70–99)
Glucose-Capillary: 123 mg/dL — ABNORMAL HIGH (ref 70–99)

## 2019-08-21 LAB — PHOSPHORUS
Phosphorus: 4 mg/dL (ref 2.5–4.6)
Phosphorus: 4 mg/dL (ref 2.5–4.6)

## 2019-08-21 LAB — FERRITIN: Ferritin: 346 ng/mL — ABNORMAL HIGH (ref 24–336)

## 2019-08-21 LAB — D-DIMER, QUANTITATIVE: D-Dimer, Quant: >20 ug/mL-FEU — ABNORMAL HIGH (ref 0.00–0.50)

## 2019-08-21 LAB — C-REACTIVE PROTEIN: CRP: 2.5 mg/dL — ABNORMAL HIGH (ref <1.0)

## 2019-08-21 MED ORDER — FUROSEMIDE 10 MG/ML IJ SOLN
40.0000 mg | Freq: Once | INTRAMUSCULAR | Status: AC
  Administered 2019-08-21: 10:00:00 40 mg via INTRAVENOUS
  Filled 2019-08-21: qty 4

## 2019-08-21 MED ORDER — MAGNESIUM SULFATE 2 GM/50ML IV SOLN
2.0000 g | Freq: Once | INTRAVENOUS | Status: AC
  Administered 2019-08-21: 12:00:00 2 g via INTRAVENOUS
  Filled 2019-08-21: qty 50

## 2019-08-21 MED ORDER — POTASSIUM CHLORIDE CRYS ER 20 MEQ PO TBCR
20.0000 meq | EXTENDED_RELEASE_TABLET | Freq: Two times a day (BID) | ORAL | Status: AC
  Administered 2019-08-21 – 2019-08-22 (×4): 20 meq via ORAL
  Filled 2019-08-21 (×4): qty 1

## 2019-08-21 MED ORDER — ZOLPIDEM TARTRATE 5 MG PO TABS: 2.5000 mg | ORAL_TABLET | Freq: Every evening | ORAL | Status: DC | PRN

## 2019-08-21 NOTE — Progress Notes (Signed)
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  Patient started having symptoms of fever and cough at around Thanksgiving, 11/26, tested positive about 1 week ago.  He started developing more shortness of breath, on 12/6 was seen in the emergency room, no hypoxia on ambulation, sent home with azithromycin.  Came back with worsening shortness of breath and hypoxia.  In the emergency room, his saturations were 60% and he started 100% nonrebreather.  Chest x-ray showed multifocal pneumonia.  Admited to the hospital with multifocal pneumonia due to COVID-19 with worsening hypoxemia. 08/20/2019: Remains persistently hypoxemic on 100% nonrebreather.  D-dimer elevated more than 20.  Assessment & Plan:   Active Problems:   Pneumonia due to COVID-19 virus   Essential hypertension   Hypothyroidism   HLD (hyperlipidemia)   BPH (benign prostatic hyperplasia)  Pneumonia due to COVID-19 virus, acute respiratory disease due to COVID-19 with acute hypoxemic respiratory failure: Continue to monitor due to significant symptoms, still remains with very high oxygen demand. chest physiotherapy, incentive spirometry, deep breathing exercises, sputum induction, mucolytic's and bronchodilators.  Out of bed to chair. Supplemental oxygen to keep saturations more than 80%. Covid directed therapy with , steroids on dexamethasone remdesivir day 5/5 actemra dose on 12/8, repeat dose on 12/9 convalescent plasma ,12/9, 1 unit received.  antibiotics not indicated.  Procalcitonin normal. D-dimer was elevated more than 20, on therapeutic Lovenox.  Tolerating.  Chest CTA and lower extremity duplexes with no evidence of thromboembolism.  Due to severity of symptoms, patient will need daily inflammatory markers, liver function test to monitor and direct  COVID-19 therapies. Trying Lasix intermittent doses to induce diuresis and aim for negative balance.  Hypertension: Blood pressure stable.  He is on amlodipine and Terazosin.  Resumed amlodipine.   Hyperlipidemia: On a statin at home.  Continue.  BPH: On Flomax that we will continue.  Hypothyroidism: Resumed home thyroxine.  TSH normal.     DVT prophylaxis: Therapeutic Lovenox. Code Status: Full code Family Communication: Patient's wife, Dr. Toy Care Disposition Plan: Critically ill, remains in the hospital   Consultants:   PCCM  Procedures:   None  Antimicrobials:   Remdesivir, 08/17/2019---   Subjective: Patient seen and examined.  He tolerated high flow nasal cannula well overnight.  Does not have good appetite.  Remains afebrile.  Was able to produce some sputum and coughed out mucoid phlegm. Abdominal and chest pain is better today. Had bowel movement with streaks of blood, will monitor.  Objective: Vitals:   08/21/19 0710 08/21/19 0751 08/21/19 0948 08/21/19 1311  BP:  (!) 141/82 122/81   Pulse: 67 71    Resp: (!) 23 20    Temp:  98.3 F (36.8 C)    TempSrc:  Oral    SpO2: 90% 92%  91%  Weight:      Height:        Intake/Output Summary (Last 24 hours) at 08/21/2019 1331 Last data filed at 08/21/2019 0600 Gross per 24 hour  Intake 1370 ml  Output 850 ml  Net 520 ml   Filed Weights   08/17/19 0939  Weight: 72.6 kg    Examination:  Physical Exam  Constitutional: He is oriented to person, place, and time.  In moderate respiratory distress, anxious.  Currently on 15 L high flow nasal cannula.  HENT:  Head: Normocephalic and  atraumatic.  Eyes: Pupils are equal, round, and reactive to light.  Cardiovascular: Normal rate and regular rhythm. Exam reveals no friction rub.  Pulmonary/Chest:  Moderate respiratory distress.  Bilateral expiratory crackles mostly at posterior bases.  Abdominal: Bowel sounds are normal. He exhibits no distension.    Musculoskeletal:     Cervical back: Neck supple.  Neurological: He is alert and oriented to person, place, and time.  Psychiatric: He has a normal mood and affect.      Data Reviewed: I have personally reviewed following labs and imaging studies  CBC: Recent Labs  Lab 08/15/19 1740 08/17/19 0949 08/17/19 1156 08/18/19 0158 08/19/19 0158 08/20/19 0110 08/21/19 0000  WBC 9.9 12.5*  --  8.6 7.5 7.8 9.6  NEUTROABS 8.7* 11.6*  --   --   --   --   --   HGB 12.3* 11.3* 11.2* 11.0* 11.1* 11.3* 12.9*  HCT 36.5* 33.4* 33.0* 32.8* 33.1* 33.8* 38.2*  MCV 89.5 90.3  --  89.4 88.7 88.9 88.4  PLT 256 279  --  289 366 353 [id number]   Basic Metabolic Panel: Recent Labs  Lab 08/17/19 0949 08/17/19 1156 08/18/19 0158 08/19/19 0158 08/20/19 0110 08/21/19 0000  NA 139 139 139 141 140 139  K 3.9 3.6 3.9 3.6 3.9 3.7  CL 110  --  110 109 107 102  CO2 20*  --  22 22 23 24   GLUCOSE 118*  --  128* 146* 114* 111*  BUN 18  --  27* 31* 30* 29*  CREATININE 0.72  --  0.50* 0.56* 0.63 0.70  CALCIUM 8.1*  --  7.8* 7.9* 7.8* 7.8*  MG  --   --   --   --   --  2.3  PHOS  --   --   --   --   --  4.0   GFR: Estimated Creatinine Clearance: 74.2 mL/min (by C-G formula based on SCr of 0.7 mg/dL). Liver Function Tests: Recent Labs  Lab 08/17/19 0949 08/18/19 0158 08/19/19 0158 08/20/19 0110 08/21/19 0000  AST 31 53* 53* 52* 58*  ALT 30 45* 56* 66* 85*  ALKPHOS 40 41 42 52 57  BILITOT 0.6 0.8 0.5 0.6 1.0  PROT 6.3* 6.1* 5.9* 5.8* 6.2*  ALBUMIN 2.5* 2.4* 2.3* 2.4* 2.7*   No results for input(s): LIPASE, AMYLASE in the last 168 hours. No results for input(s): AMMONIA in the last 168 hours. Coagulation Profile: No results for input(s): INR, PROTIME in the last 168 hours. Cardiac Enzymes: No results for input(s): CKTOTAL, CKMB, CKMBINDEX, TROPONINI in the last 168 hours. BNP (last 3 results) No results for input(s): PROBNP in the last 8760 hours. HbA1C: No results for input(s): HGBA1C in the  last 72 hours. CBG: Recent Labs  Lab 08/20/19 0709 08/20/19 1145 08/20/19 2041 08/21/19 0722 08/21/19 1150  GLUCAP 105* 137* 126* 93 123*   Lipid Profile: No results for input(s): CHOL, HDL, LDLCALC, TRIG, CHOLHDL, LDLDIRECT in the last 72 hours. Thyroid Function Tests: Recent Labs    08/20/19 0110  TSH 1.330   Anemia Panel: Recent Labs    08/20/19 0110 08/21/19 0000  FERRITIN 335 346*   Sepsis Labs: Recent Labs  Lab 08/15/19 1740 08/17/19 0949 08/17/19 1145 08/17/19 1345 08/20/19 0110 08/21/19 0000  PROCALCITON  --   --   --  <0.10 <0.10 <0.10  LATICACIDVEN 1.6 2.1* 2.2*  --   --   --     Recent Results (from the past 240  hour(s))  Blood culture (routine x 2)     Status: None   Collection Time: 08/15/19  5:40 PM   Specimen: BLOOD  Result Value Ref Range Status   Specimen Description BLOOD LEFT ANTECUBITAL  Final   Special Requests   Final    BOTTLES DRAWN AEROBIC AND ANAEROBIC Blood Culture results may not be optimal due to an inadequate volume of blood received in culture bottles   Culture   Final    NO GROWTH 5 DAYS Performed at [facility], [place] [address] [place], [place] [address]    Report Status 08/20/2019 FINAL  Final  Blood culture (routine x 2)     Status: None   Collection Time: 08/15/19  5:42 PM   Specimen: BLOOD  Result Value Ref Range Status   Specimen Description BLOOD RIGHT ANTECUBITAL  Final   Special Requests   Final    BOTTLES DRAWN AEROBIC AND ANAEROBIC Blood Culture results may not be optimal due to an excessive volume of blood received in culture bottles   Culture   Final    NO GROWTH 5 DAYS Performed at [facility], [place] [address] [place], [place] [address]    Report Status 08/20/2019 FINAL  Final  Blood culture (routine x 2)     Status: None (Preliminary result)   Collection Time: 08/17/19 11:36 AM   Specimen: BLOOD  Result Value Ref Range Status   Specimen Description BLOOD LEFT ANTECUBITAL  Final   Special  Requests   Final    BOTTLES DRAWN AEROBIC AND ANAEROBIC Blood Culture adequate volume   Culture   Final    NO GROWTH 4 DAYS Performed at [facility], [place] [address], [place], [place] [address]    Report Status PENDING  Incomplete  Blood culture (routine x 2)     Status: None (Preliminary result)   Collection Time: 08/17/19 12:04 PM   Specimen: BLOOD RIGHT HAND  Result Value Ref Range Status   Specimen Description BLOOD RIGHT HAND  Final   Special Requests   Final    BOTTLES DRAWN AEROBIC ONLY Blood Culture results may not be optimal due to an inadequate volume of blood received in culture bottles   Culture   Final    NO GROWTH 4 DAYS Performed at [facility], [place] [address], [place], [place] [address]    Report Status PENDING  Incomplete         Radiology Studies: CT ANGIO CHEST PE W OR WO CONTRAST  Result Date: 08/20/2019 CLINICAL DATA:  Hypoxemia. COVID-19 infection. EXAM: CT ANGIOGRAPHY CHEST WITH CONTRAST TECHNIQUE: Multidetector CT imaging of the chest was performed using the standard protocol during bolus administration of intravenous contrast. Multiplanar CT image reconstructions and MIPs were obtained to evaluate the vascular anatomy. CONTRAST:  81mL OMNIPAQUE IOHEXOL 350 MG/ML SOLN COMPARISON:  CT chest 08/15/2019 FINDINGS: Cardiovascular: Scattered atherosclerosis. No signs of aneurysm in the aorta in the chest. Heart size stable without significant pericardial fluid. Signs of coronary artery disease as before. No sign of acute pulmonary embolism. Mediastinum/Nodes: Mild enlargement of subcarinal nodal tissue slightly increased from previous study approximately 1.4 cm in short axis, likely reactive. Thoracic inlet structures are unremarkable. Mild enlargement of bilateral hilar lymph nodes. Lungs/Pleura: Since the previous CT assessment of 08/15/2019 there has been development of small bilateral pleural fluid collections right greater than left. There is diffuse  ground-glass in the chest, associated also with septal thickening nearly confluent in  the right chest with minimal sparing in the right lung. Left lower lobe and left lung apex with similar involvement but with sparing of the left mid chest. Upper Abdomen: Stable bilateral adrenal thickening. No acute findings in the upper abdomen. Musculoskeletal: No signs of acute bone finding or destructive bone process. Review of the MIP images confirms the above findings. IMPRESSION: 1. Negative for acute pulmonary embolism. 2. Interval development of small bilateral pleural fluid collections right greater than left. 3. Worsening of areas of ground-glass and septal thickening, now more confluent than on the prior study suspicious for edema and or developing ARDS superimposed on multifocal pneumonia in the setting of COVID-19 infection. 4. Mild enlargement of bilateral hilar lymph nodes, likely reactive. 5. Coronary artery and aortic atherosclerosis. Aortic Atherosclerosis (ICD10-I70.0). Electronically Signed   By: Zetta Bills M.D.   On: 08/20/2019 13:00   VAS [place] LOWER EXTREMITY VENOUS (DVT)  Result Date: 08/21/2019  Lower Venous Study Indications: Elevated d-dimer. Other Indications: COVID+. Comparison Study: No prior study. Performing Technologist: Maudry Mayhew RDMS, RVT, RDCS  Examination Guidelines: A complete evaluation includes B-mode imaging, spectral Doppler, color Doppler, and power Doppler as needed of all accessible portions of each vessel. Bilateral testing is considered an integral part of a complete examination. Limited examinations for reoccurring indications may be performed as noted.  +---------+---------------+---------+-----------+----------+--------------+ RIGHT    CompressibilityPhasicitySpontaneityPropertiesThrombus Aging +---------+---------------+---------+-----------+----------+--------------+ CFV      Full           Yes      Yes                                  +---------+---------------+---------+-----------+----------+--------------+ SFJ      Full                                                        +---------+---------------+---------+-----------+----------+--------------+ FV Prox  Full                                                        +---------+---------------+---------+-----------+----------+--------------+ FV Mid   Full                                                        +---------+---------------+---------+-----------+----------+--------------+ FV DistalFull                                                        +---------+---------------+---------+-----------+----------+--------------+ PFV      Full                                                        +---------+---------------+---------+-----------+----------+--------------+  POP      Full           Yes      Yes                                 +---------+---------------+---------+-----------+----------+--------------+ PTV      Full                                                        +---------+---------------+---------+-----------+----------+--------------+ PERO     Full                                                        +---------+---------------+---------+-----------+----------+--------------+   +---------+---------------+---------+-----------+----------+--------------+ LEFT     CompressibilityPhasicitySpontaneityPropertiesThrombus Aging +---------+---------------+---------+-----------+----------+--------------+ CFV      Full           Yes      Yes                                 +---------+---------------+---------+-----------+----------+--------------+ SFJ      Full                                                        +---------+---------------+---------+-----------+----------+--------------+ FV Prox  Full                                                         +---------+---------------+---------+-----------+----------+--------------+ FV Mid   Full                                                        +---------+---------------+---------+-----------+----------+--------------+ FV DistalFull                                                        +---------+---------------+---------+-----------+----------+--------------+ PFV      Full                                                        +---------+---------------+---------+-----------+----------+--------------+ POP      Full           Yes      Yes                                 +---------+---------------+---------+-----------+----------+--------------+  PTV      Full                                                        +---------+---------------+---------+-----------+----------+--------------+ PERO     Full                                                        +---------+---------------+---------+-----------+----------+--------------+     Summary: Right: There is no evidence of deep vein thrombosis in the lower extremity. No cystic structure found in the popliteal fossa. Left: There is no evidence of deep vein thrombosis in the lower extremity. No cystic structure found in the popliteal fossa.  *See table(s) above for measurements and observations. Electronically signed by Servando Snare MD on 08/21/2019 at 10:19:08 AM.    Final         Scheduled Meds: . sodium chloride   Intravenous Once  . albuterol  2 puff Inhalation Q6H  . amLODipine  5 mg Oral Daily  . dexamethasone (DECADRON) injection  10 mg Intravenous Q24H  . enoxaparin (LOVENOX) injection  70 mg Subcutaneous Q12H  . famotidine  20 mg Oral BID  . feeding supplement (ENSURE ENLIVE)  237 mL Oral BID BM  . finasteride  5 mg Oral Daily  . levothyroxine  150 mcg Oral Q0600  . potassium chloride  20 mEq Oral BID  . rosuvastatin  10 mg Oral q1800  . senna-docusate  1 tablet Oral BID  . tamsulosin  0.4 mg  Oral Daily   Continuous Infusions:    LOS: 4 days    Time spent: 30 minutes    Barb Merino, MD [facility] Pager [phone]

## 2019-08-21 NOTE — Plan of Care (Signed)

## 2019-08-22 LAB — COMPREHENSIVE METABOLIC PANEL
ALT: 65 U/L — ABNORMAL HIGH (ref 0–44)
AST: 50 U/L — ABNORMAL HIGH (ref 15–41)
Albumin: 2.6 g/dL — ABNORMAL LOW (ref 3.5–5.0)
Alkaline Phosphatase: 59 U/L (ref 38–126)
Anion gap: 12 (ref 5–15)
BUN: 36 mg/dL — ABNORMAL HIGH (ref 8–23)
CO2: 23 mmol/L (ref 22–32)
Calcium: 7.9 mg/dL — ABNORMAL LOW (ref 8.9–10.3)
Chloride: 104 mmol/L (ref 98–111)
Creatinine, Ser: 0.74 mg/dL (ref 0.61–1.24)
GFR calc Af Amer: >60 mL/min (ref >60)
GFR calc non Af Amer: >60 mL/min (ref >60)
Glucose, Bld: 79 mg/dL (ref 70–99)
Potassium: 4.8 mmol/L (ref 3.5–5.1)
Sodium: 139 mmol/L (ref 135–145)
Total Bilirubin: 1.6 mg/dL — ABNORMAL HIGH (ref 0.3–1.2)
Total Protein: 5.8 g/dL — ABNORMAL LOW (ref 6.5–8.1)

## 2019-08-22 LAB — CBC
HCT: 38.6 % — ABNORMAL LOW (ref 39.0–52.0)
Hemoglobin: 13 g/dL (ref 13.0–17.0)
MCH: 30 pg (ref 26.0–34.0)
MCHC: 33.7 g/dL (ref 30.0–36.0)
MCV: 88.9 fL (ref 80.0–100.0)
Platelets: 436 10*3/uL — ABNORMAL HIGH (ref 150–400)
RBC: 4.34 MIL/uL (ref 4.22–5.81)
RDW: 14.1 % (ref 11.5–15.5)
WBC: 11.1 10*3/uL — ABNORMAL HIGH (ref 4.0–10.5)
nRBC: 0 % (ref 0.0–0.2)

## 2019-08-22 LAB — CULTURE, BLOOD (ROUTINE X 2)
Culture: NO GROWTH
Culture: NO GROWTH
Special Requests: ADEQUATE

## 2019-08-22 LAB — FERRITIN: Ferritin: 298 ng/mL (ref 24–336)

## 2019-08-22 LAB — D-DIMER, QUANTITATIVE: D-Dimer, Quant: 15.5 ug/mL-FEU — ABNORMAL HIGH (ref 0.00–0.50)

## 2019-08-22 LAB — C-REACTIVE PROTEIN: CRP: 1.2 mg/dL — ABNORMAL HIGH (ref <1.0)

## 2019-08-22 MED ORDER — FUROSEMIDE 10 MG/ML IJ SOLN
40.0000 mg | Freq: Once | INTRAMUSCULAR | Status: AC
  Administered 2019-08-22: 16:00:00 40 mg via INTRAVENOUS
  Filled 2019-08-22: qty 4

## 2019-08-22 NOTE — Progress Notes (Signed)
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  Patient started having symptoms of fever and cough at around Thanksgiving, 11/26, tested positive about 1 week ago.  He started developing more shortness of breath, on 12/6 was seen in the emergency room, no hypoxia on ambulation, sent home with azithromycin.  Came back with worsening shortness of breath and hypoxia.  In the emergency room, his saturations were 60% and he started 100% nonrebreather.  Chest x-ray showed multifocal pneumonia.  Admited to the hospital with multifocal pneumonia due to COVID-19 with worsening hypoxemia. 08/20/2019: Remains persistently hypoxemic on 100% nonrebreather.  D-dimer elevated more than 20.  Started on Lovenox. 08/22/2019: Clinically improving and able to wean off to the 10 L of oxygen via nasal cannula.  Appetite improving.  Assessment & Plan:   Active Problems:   Pneumonia due to COVID-19 virus   Essential hypertension   Hypothyroidism   HLD (hyperlipidemia)   BPH (benign prostatic hyperplasia)  Pneumonia due to COVID-19 virus, acute respiratory disease due to COVID-19 with acute hypoxemic respiratory failure: Continue to monitor due to significant symptoms, still remains with very high oxygen demand. chest physiotherapy, incentive spirometry, deep breathing exercises, sputum induction, mucolytic's and bronchodilators.  Out of bed to chair. Supplemental oxygen to keep saturations more than 85%. Covid directed therapy with , steroids on dexamethasone remdesivir finished 5 days of therapy. actemra dose on 12/8, repeat dose on 12/9 convalescent plasma ,12/9, 1 unit received.  antibiotics not indicated.  Procalcitonin normal. D-dimer was elevated more than 20, on therapeutic Lovenox.  Tolerating.  Chest CTA and lower extremity duplexes  with no evidence of thromboembolism.  Due to severity of symptoms, patient will need daily inflammatory markers, liver function test to monitor and direct COVID-19 therapies. Trying Lasix intermittent doses to induce diuresis and aim for negative balance.  1 more dose of Lasix today.  Hypertension: Blood pressure stable.  He is on amlodipine and Terazosin.  Resumed amlodipine.   Hyperlipidemia: On a statin at home.  Continue.  BPH: On Flomax that we will continue.  Hypothyroidism: Resumed home thyroxine.  TSH normal.   Mobilize.  PT OT.  Continue to wean off oxygen.  Suggest more nutrition.  DVT prophylaxis: Therapeutic Lovenox. Code Status: Full code Family Communication: Patient's wife, Dr. Toy Care Disposition Plan: Critically ill, remains in the hospital   Consultants:   PCCM  Procedures:   None  Antimicrobials:   Remdesivir, 08/17/2019--- 08/21/2019   Subjective: Patient seen and examined.  No overnight events.  Oxygenation remained better.  He was able to talk more, have open conversation.  Doing chest physiotherapy.  Mobilizing. Tolerating Lovenox. Had some confusion but no persistent hallucination or delusions. He was able to eat better since yesterday.  Objective: Vitals:   08/21/19 1644 08/21/19 2052 08/22/19 0600 08/22/19 0750  BP:  115/90 116/70   Pulse:  69 74   Resp:  (!) 23 (!) 25   Temp:  98.2 F (36.8 C) 98.4 F (36.9 C)   TempSrc:  Oral Oral   SpO2: (!) 89% 93% 90% (!) 89%  Weight:      Height:        Intake/Output Summary (Last 24 hours) at 08/22/2019 1250 Last data filed at 08/22/2019 0938 Gross per 24 hour  Intake 880 ml  Output 1400 ml  Net -520 ml  Filed Weights   08/17/19 0939  Weight: 72.6 kg    Examination:  Physical Exam  Constitutional: He is oriented to person, place, and time.  In mild respiratory distress, able to talk in full sentences.  HENT:  Head: Normocephalic and atraumatic.  Eyes: Pupils are equal, round, and  reactive to light.  Cardiovascular: Normal rate and regular rhythm. Exam reveals no friction rub.  Pulmonary/Chest:  Mild respiratory distress.  On 10 L high flow.  Bilateral expiratory crackles.  Abdominal: Bowel sounds are normal. He exhibits no distension.  Musculoskeletal:     Cervical back: Neck supple.  Neurological: He is alert and oriented to person, place, and time.  Psychiatric: He has a normal mood and affect.      Data Reviewed: I have personally reviewed following labs and imaging studies  CBC: Recent Labs  Lab 08/15/19 1740 08/17/19 0949 08/17/19 1156 08/18/19 0158 08/19/19 0158 08/20/19 0110 08/21/19 0000  WBC 9.9 12.5*  --  8.6 7.5 7.8 9.6  NEUTROABS 8.7* 11.6*  --   --   --   --   --   HGB 12.3* 11.3* 11.2* 11.0* 11.1* 11.3* 12.9*  HCT 36.5* 33.4* 33.0* 32.8* 33.1* 33.8* 38.2*  MCV 89.5 90.3  --  89.4 88.7 88.9 88.4  PLT 256 279  --  289 366 353 [id number]   Basic Metabolic Panel: Recent Labs  Lab 08/18/19 0158 08/19/19 0158 08/20/19 0110 08/21/19 0000 08/21/19 1145 08/22/19 0528  NA 139 141 140 139  --  139  K 3.9 3.6 3.9 3.7  --  4.8  CL 110 109 107 102  --  104  CO2 22 22 23 24   --  23  GLUCOSE 128* 146* 114* 111*  --  79  BUN 27* 31* 30* 29*  --  36*  CREATININE 0.50* 0.56* 0.63 0.70  --  0.74  CALCIUM 7.8* 7.9* 7.8* 7.8*  --  7.9*  MG  --   --   --  2.3 2.2  --   PHOS  --   --   --  4.0 4.0  --    GFR: Estimated Creatinine Clearance: 74.2 mL/min (by C-G formula based on SCr of 0.74 mg/dL). Liver Function Tests: Recent Labs  Lab 08/18/19 0158 08/19/19 0158 08/20/19 0110 08/21/19 0000 08/22/19 0528  AST 53* 53* 52* 58* 50*  ALT 45* 56* 66* 85* 65*  ALKPHOS 41 42 52 57 59  BILITOT 0.8 0.5 0.6 1.0 1.6*  PROT 6.1* 5.9* 5.8* 6.2* 5.8*  ALBUMIN 2.4* 2.3* 2.4* 2.7* 2.6*   No results for input(s): LIPASE, AMYLASE in the last 168 hours. No results for input(s): AMMONIA in the last 168 hours. Coagulation Profile: No results for input(s):  INR, PROTIME in the last 168 hours. Cardiac Enzymes: No results for input(s): CKTOTAL, CKMB, CKMBINDEX, TROPONINI in the last 168 hours. BNP (last 3 results) No results for input(s): PROBNP in the last 8760 hours. HbA1C: No results for input(s): HGBA1C in the last 72 hours. CBG: Recent Labs  Lab 08/20/19 0709 08/20/19 1145 08/20/19 2041 08/21/19 0722 08/21/19 1150  GLUCAP 105* 137* 126* 93 123*   Lipid Profile: No results for input(s): CHOL, HDL, LDLCALC, TRIG, CHOLHDL, LDLDIRECT in the last 72 hours. Thyroid Function Tests: Recent Labs    08/20/19 0110  TSH 1.330   Anemia Panel: Recent Labs    08/21/19 0000 08/22/19 0528  FERRITIN 346* 298   Sepsis Labs: Recent Labs  Lab 08/15/19 1740 08/17/19 0949  08/17/19 1145 08/17/19 1345 08/20/19 0110 08/21/19 0000  PROCALCITON  --   --   --  <0.10 <0.10 <0.10  LATICACIDVEN 1.6 2.1* 2.2*  --   --   --     Recent Results (from the past 240 hour(s))  Blood culture (routine x 2)     Status: None   Collection Time: 08/15/19  5:40 PM   Specimen: BLOOD  Result Value Ref Range Status   Specimen Description BLOOD LEFT ANTECUBITAL  Final   Special Requests   Final    BOTTLES DRAWN AEROBIC AND ANAEROBIC Blood Culture results may not be optimal due to an inadequate volume of blood received in culture bottles   Culture   Final    NO GROWTH 5 DAYS Performed at [facility], [place] [address], [place], [place] [address]    Report Status 08/20/2019 FINAL  Final  Blood culture (routine x 2)     Status: None   Collection Time: 08/15/19  5:42 PM   Specimen: BLOOD  Result Value Ref Range Status   Specimen Description BLOOD RIGHT ANTECUBITAL  Final   Special Requests   Final    BOTTLES DRAWN AEROBIC AND ANAEROBIC Blood Culture results may not be optimal due to an excessive volume of blood received in culture bottles   Culture   Final    NO GROWTH 5 DAYS Performed at [facility], [place] [address], [place], [place]  [address]    Report Status 08/20/2019 FINAL  Final  Blood culture (routine x 2)     Status: None   Collection Time: 08/17/19 11:36 AM   Specimen: BLOOD  Result Value Ref Range Status   Specimen Description BLOOD LEFT ANTECUBITAL  Final   Special Requests   Final    BOTTLES DRAWN AEROBIC AND ANAEROBIC Blood Culture adequate volume   Culture   Final    NO GROWTH 5 DAYS Performed at [facility], [place] [address] [place], [place] [address]    Report Status 08/22/2019 FINAL  Final  Blood culture (routine x 2)     Status: None   Collection Time: 08/17/19 12:04 PM   Specimen: BLOOD RIGHT HAND  Result Value Ref Range Status   Specimen Description BLOOD RIGHT HAND  Final   Special Requests   Final    BOTTLES DRAWN AEROBIC ONLY Blood Culture results may not be optimal due to an inadequate volume of blood received in culture bottles   Culture   Final    NO GROWTH 5 DAYS Performed at [facility], [place] [address], [place],  [address]    Report Status 08/22/2019 FINAL  Final         Radiology Studies: VAS [place] LOWER EXTREMITY VENOUS (DVT)  Result Date: 08/21/2019  Lower Venous Study Indications: Elevated d-dimer. Other Indications: COVID+. Comparison Study: No prior study. Performing Technologist: Maudry Mayhew RDMS, RVT, RDCS  Examination Guidelines: A complete evaluation includes B-mode imaging, spectral Doppler, color Doppler, and power Doppler as needed of all accessible portions of each vessel. Bilateral testing is considered an integral part of a complete examination. Limited examinations for reoccurring indications may be performed as noted.  +---------+---------------+---------+-----------+----------+--------------+ RIGHT    CompressibilityPhasicitySpontaneityPropertiesThrombus Aging +---------+---------------+---------+-----------+----------+--------------+ CFV      Full           Yes      Yes                                  +---------+---------------+---------+-----------+----------+--------------+  SFJ      Full                                                        +---------+---------------+---------+-----------+----------+--------------+ FV Prox  Full                                                        +---------+---------------+---------+-----------+----------+--------------+ FV Mid   Full                                                        +---------+---------------+---------+-----------+----------+--------------+ FV DistalFull                                                        +---------+---------------+---------+-----------+----------+--------------+ PFV      Full                                                        +---------+---------------+---------+-----------+----------+--------------+ POP      Full           Yes      Yes                                 +---------+---------------+---------+-----------+----------+--------------+ PTV      Full                                                        +---------+---------------+---------+-----------+----------+--------------+ PERO     Full                                                        +---------+---------------+---------+-----------+----------+--------------+   +---------+---------------+---------+-----------+----------+--------------+ LEFT     CompressibilityPhasicitySpontaneityPropertiesThrombus Aging +---------+---------------+---------+-----------+----------+--------------+ CFV      Full           Yes      Yes                                 +---------+---------------+---------+-----------+----------+--------------+ SFJ      Full                                                        +---------+---------------+---------+-----------+----------+--------------+  FV Prox  Full                                                         +---------+---------------+---------+-----------+----------+--------------+ FV Mid   Full                                                        +---------+---------------+---------+-----------+----------+--------------+ FV DistalFull                                                        +---------+---------------+---------+-----------+----------+--------------+ PFV      Full                                                        +---------+---------------+---------+-----------+----------+--------------+ POP      Full           Yes      Yes                                 +---------+---------------+---------+-----------+----------+--------------+ PTV      Full                                                        +---------+---------------+---------+-----------+----------+--------------+ PERO     Full                                                        +---------+---------------+---------+-----------+----------+--------------+     Summary: Right: There is no evidence of deep vein thrombosis in the lower extremity. No cystic structure found in the popliteal fossa. Left: There is no evidence of deep vein thrombosis in the lower extremity. No cystic structure found in the popliteal fossa.  *See table(s) above for measurements and observations. Electronically signed by Servando Snare MD on 08/21/2019 at 10:19:08 AM.    Final         Scheduled Meds: . sodium chloride   Intravenous Once  . albuterol  2 puff Inhalation Q6H  . amLODipine  5 mg Oral Daily  . dexamethasone (DECADRON) injection  10 mg Intravenous Q24H  . enoxaparin (LOVENOX) injection  70 mg Subcutaneous Q12H  . famotidine  20 mg Oral BID  . feeding supplement (ENSURE ENLIVE)  237 mL Oral BID BM  . finasteride  5 mg Oral Daily  . furosemide  40 mg Intravenous Once  . levothyroxine  150 mcg Oral Q0600  . potassium chloride  20 mEq Oral  BID  . rosuvastatin  10 mg Oral q1800  . senna-docusate  1  tablet Oral BID  . tamsulosin  0.4 mg Oral Daily   Continuous Infusions:    LOS: 5 days    Time spent: 30 minutes    Barb Merino, MD [facility] Pager [phone]

## 2019-08-22 NOTE — Progress Notes (Signed)
Pt wife updated on all lab levels & discussed activity plan w/ PT. Patient also spoke w/ wife. All questions & concerns were addressed.  Pt is trying to eat better today, appetite has improved he states. Consuming <25% of breakfast & lunch. Pt was given 40mg  IV lasix this afternoon.   **Titrated O2 from 10L/HFNC to 7L/HFNC (89-92%)-goal per MD.  Pt was experiencing abdominal burning/ discomfort r/t his hernia & coughing. Refused pain medication. Abdominal binder in place.

## 2019-08-22 NOTE — Progress Notes (Signed)
ICU CN/RRN Rounding Note - Patient with oxygen saturation 98% on 10L HFNC. No respiratory distress. Patient remarked that he was feeling improved. If the bedside RN needs any assistance please call charge [phone]

## 2019-08-23 LAB — COMPREHENSIVE METABOLIC PANEL
ALT: 71 U/L — ABNORMAL HIGH (ref 0–44)
AST: 40 U/L (ref 15–41)
Albumin: 2.7 g/dL — ABNORMAL LOW (ref 3.5–5.0)
Alkaline Phosphatase: 50 U/L (ref 38–126)
Anion gap: 12 (ref 5–15)
BUN: 44 mg/dL — ABNORMAL HIGH (ref 8–23)
CO2: 24 mmol/L (ref 22–32)
Calcium: 8 mg/dL — ABNORMAL LOW (ref 8.9–10.3)
Chloride: 106 mmol/L (ref 98–111)
Creatinine, Ser: 0.76 mg/dL (ref 0.61–1.24)
GFR calc Af Amer: >60 mL/min (ref >60)
GFR calc non Af Amer: >60 mL/min (ref >60)
Glucose, Bld: 101 mg/dL — ABNORMAL HIGH (ref 70–99)
Potassium: 4.1 mmol/L (ref 3.5–5.1)
Sodium: 142 mmol/L (ref 135–145)
Total Bilirubin: 0.7 mg/dL (ref 0.3–1.2)
Total Protein: 5.9 g/dL — ABNORMAL LOW (ref 6.5–8.1)

## 2019-08-23 LAB — MAGNESIUM: Magnesium: 2.3 mg/dL (ref 1.7–2.4)

## 2019-08-23 LAB — D-DIMER, QUANTITATIVE: D-Dimer, Quant: 13.29 ug/mL-FEU — ABNORMAL HIGH (ref 0.00–0.50)

## 2019-08-23 LAB — CBC WITH DIFFERENTIAL/PLATELET
Abs Immature Granulocytes: 0.17 10*3/uL — ABNORMAL HIGH (ref 0.00–0.07)
Basophils Absolute: 0 10*3/uL (ref 0.0–0.1)
Basophils Relative: 0 %
Eosinophils Absolute: 0 10*3/uL (ref 0.0–0.5)
Eosinophils Relative: 0 %
HCT: 37.9 % — ABNORMAL LOW (ref 39.0–52.0)
Hemoglobin: 12.6 g/dL — ABNORMAL LOW (ref 13.0–17.0)
Immature Granulocytes: 2 %
Lymphocytes Relative: 13 %
Lymphs Abs: 1.3 10*3/uL (ref 0.7–4.0)
MCH: 30.1 pg (ref 26.0–34.0)
MCHC: 33.2 g/dL (ref 30.0–36.0)
MCV: 90.7 fL (ref 80.0–100.0)
Monocytes Absolute: 0.4 10*3/uL (ref 0.1–1.0)
Monocytes Relative: 4 %
Neutro Abs: 8 10*3/uL — ABNORMAL HIGH (ref 1.7–7.7)
Neutrophils Relative %: 81 %
Platelets: 394 10*3/uL (ref 150–400)
RBC: 4.18 MIL/uL — ABNORMAL LOW (ref 4.22–5.81)
RDW: 14.2 % (ref 11.5–15.5)
WBC: 9.9 10*3/uL (ref 4.0–10.5)
nRBC: 0 % (ref 0.0–0.2)

## 2019-08-23 LAB — PHOSPHORUS: Phosphorus: 4.1 mg/dL (ref 2.5–4.6)

## 2019-08-23 LAB — C-REACTIVE PROTEIN: CRP: 0.8 mg/dL (ref <1.0)

## 2019-08-23 MED ORDER — DEXAMETHASONE 6 MG PO TABS
6.0000 mg | ORAL_TABLET | Freq: Every day | ORAL | Status: DC
  Administered 2019-08-24 – 2019-08-26 (×3): 6 mg via ORAL
  Filled 2019-08-23 (×3): qty 1

## 2019-08-23 NOTE — Progress Notes (Signed)
Pt is now on 2L [place] in recliner (95-100%) Pt appetite was better today 50-75% of all meals. Pt worked w/ PT today & did well-see notes for details.  SQ lovenox cont. BM today 12/14  **Pt family dropped off zebra print bag-bag given to pt.

## 2019-08-23 NOTE — Progress Notes (Signed)
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  Patient started having symptoms of fever and cough at around Thanksgiving, 11/26, tested positive about 1 week ago.  He started developing more shortness of breath, on 12/6 was seen in the emergency room, no hypoxia on ambulation, sent home with azithromycin.  Came back with worsening shortness of breath and hypoxia.  In the emergency room, his saturations were 60% and he started 100% nonrebreather.  Chest x-ray showed multifocal pneumonia.  Admited to the hospital with multifocal pneumonia due to COVID-19 with worsening hypoxemia. 08/20/2019: Remained persistently hypoxemic on 100% nonrebreather.  D-dimer elevated more than 20.  Started on Lovenox. 08/22/2019: Clinically improving and able to wean off to the 10 L of oxygen via nasal cannula.  Appetite improving. 08/23/2019: improving oxygenation, now on 4-6 liters of oxygen.  Assessment & Plan:   Active Problems:   Pneumonia due to COVID-19 virus   Essential hypertension   Hypothyroidism   HLD (hyperlipidemia)   BPH (benign prostatic hyperplasia)  Pneumonia due to COVID-19 virus, acute respiratory disease due to COVID-19 with acute hypoxemic respiratory failure: Oxygen requirement continue to improve. Continue chest physiotherapy, incentive spirometry, deep breathing exercises, sputum induction, mucolytic's and bronchodilators.  Out of bed to chair. PT/OT Supplemental oxygen to keep saturations more than 85%. Covid directed therapy with , steroids on dexamethasone, change to oral dexamethasone. remdesivir finished 5 days of therapy. actemra dose on 12/8, repeat dose on 12/9 convalescent plasma ,12/9, 1 unit received.  antibiotics not indicated.  Procalcitonin normal. D-dimer was elevated more than 20, on therapeutic  Lovenox.  Tolerating.  Chest CTA and lower extremity duplexes with no evidence of thromboembolism.  Intermittent lasix is helping.   Hypertension: Blood pressure stable.  He is on amlodipine and Terazosin.  Resumed amlodipine.   Hyperlipidemia: On a statin at home.  Continue.  BPH: On Flomax that he will continue.   Hypothyroidism: Resumed home thyroxine.  TSH normal.   Mobilize.  PT OT.  Continue to wean off oxygen.  Suggest more nutrition.  DVT prophylaxis: Therapeutic Lovenox. Code Status: Full code Family Communication: Patient's wife, Dr. Toy Care Disposition Plan: Critically ill, remains in the hospital. Anticipate home with home PT/OT and home health aid.   Consultants:   PCCM  Procedures:   None  Antimicrobials:   Remdesivir, 08/17/2019--- 08/21/2019   Subjective: Patient seen and examined.  No overnight events.  Slight difficulty with cough and chest tightness but mostly improved.  Remains on 4 to 6 L of oxygen.  Very motivated to mobilize and work with therapy.  Appetite is not great but he is trying to maintain it.  Objective: Vitals:   08/22/19 2100 08/23/19 0000 08/23/19 0500 08/23/19 0809  BP: 121/75 128/79 102/62 120/69  Pulse:   72 89  Resp: 18 (!) 23  17  Temp: 98.2 F (36.8 C) 98.2 F (36.8 C) 98.2 F (36.8 C) 98.7 F (37.1 C)  TempSrc: Oral Oral Oral Oral  SpO2:      Weight:      Height:        Intake/Output Summary (Last 24 hours) at 08/23/2019 1119 Last data filed at 08/23/2019 0800 Gross per 24 hour  Intake 360 ml  Output 450 ml  Net -90 ml   Filed Weights   08/17/19 0939  Weight: 72.6 kg  Examination:  Physical Exam  Constitutional: He is oriented to person, place, and time.  Not in any distress.  On 6 L nasal cannula.  HENT:  Head: Normocephalic and atraumatic.  Eyes: Pupils are equal, round, and reactive to light.  Cardiovascular: Normal rate and regular rhythm. Exam reveals no friction rub.  Pulmonary/Chest:  Not in any  respiratory distress.  On 6 L high flow nasal cannula.  Does have bilateral fine crepitations, right more than left.  Abdominal: Bowel sounds are normal. He exhibits no distension.  Musculoskeletal:     Cervical back: Neck supple.  Neurological: He is alert and oriented to person, place, and time.  Psychiatric: He has a normal mood and affect.      Data Reviewed: I have personally reviewed following labs and imaging studies  CBC: Recent Labs  Lab 08/17/19 0949 08/17/19 1156 08/19/19 0158 08/20/19 0110 08/21/19 0000 08/22/19 1220 08/23/19 0238  WBC 12.5*   < > 7.5 7.8 9.6 11.1* 9.9  NEUTROABS 11.6*  --   --   --   --   --  8.0*  HGB 11.3*  --  11.1* 11.3* 12.9* 13.0 12.6*  HCT 33.4*  --  33.1* 33.8* 38.2* 38.6* 37.9*  MCV 90.3   < > 88.7 88.9 88.4 88.9 90.7  PLT 279   < > 366 353 387 436* 394   < > = values in this interval not displayed.   Basic Metabolic Panel: Recent Labs  Lab 08/19/19 0158 08/20/19 0110 08/21/19 0000 08/21/19 1145 08/22/19 0528 08/23/19 0238  NA 141 140 139  --  139 142  K 3.6 3.9 3.7  --  4.8 4.1  CL 109 107 102  --  104 106  CO2 22 23 24   --  23 24  GLUCOSE 146* 114* 111*  --  79 101*  BUN 31* 30* 29*  --  36* 44*  CREATININE 0.56* 0.63 0.70  --  0.74 0.76  CALCIUM 7.9* 7.8* 7.8*  --  7.9* 8.0*  MG  --   --  2.3 2.2  --  2.3  PHOS  --   --  4.0 4.0  --  4.1   GFR: Estimated Creatinine Clearance: 74.2 mL/min (by C-G formula based on SCr of 0.76 mg/dL). Liver Function Tests: Recent Labs  Lab 08/19/19 0158 08/20/19 0110 08/21/19 0000 08/22/19 0528 08/23/19 0238  AST 53* 52* 58* 50* 40  ALT 56* 66* 85* 65* 71*  ALKPHOS 42 52 57 59 50  BILITOT 0.5 0.6 1.0 1.6* 0.7  PROT 5.9* 5.8* 6.2* 5.8* 5.9*  ALBUMIN 2.3* 2.4* 2.7* 2.6* 2.7*   No results for input(s): LIPASE, AMYLASE in the last 168 hours. No results for input(s): AMMONIA in the last 168 hours. Coagulation Profile: No results for input(s): INR, PROTIME in the last 168  hours. Cardiac Enzymes: No results for input(s): CKTOTAL, CKMB, CKMBINDEX, TROPONINI in the last 168 hours. BNP (last 3 results) No results for input(s): PROBNP in the last 8760 hours. HbA1C: No results for input(s): HGBA1C in the last 72 hours. CBG: Recent Labs  Lab 08/20/19 0709 08/20/19 1145 08/20/19 2041 08/21/19 0722 08/21/19 1150  GLUCAP 105* 137* 126* 93 123*   Lipid Profile: No results for input(s): CHOL, HDL, LDLCALC, TRIG, CHOLHDL, LDLDIRECT in the last 72 hours. Thyroid Function Tests: No results for input(s): TSH, T4TOTAL, FREET4, T3FREE, THYROIDAB in the last 72 hours. Anemia Panel: Recent Labs    08/21/19 0000 08/22/19 0528  FERRITIN 346*  298   Sepsis Labs: Recent Labs  Lab 08/17/19 0949 08/17/19 1145 08/17/19 1345 08/20/19 0110 08/21/19 0000  PROCALCITON  --   --  <0.10 <0.10 <0.10  LATICACIDVEN 2.1* 2.2*  --   --   --     Recent Results (from the past 240 hour(s))  Blood culture (routine x 2)     Status: None   Collection Time: 08/15/19  5:40 PM   Specimen: BLOOD  Result Value Ref Range Status   Specimen Description BLOOD LEFT ANTECUBITAL  Final   Special Requests   Final    BOTTLES DRAWN AEROBIC AND ANAEROBIC Blood Culture results may not be optimal due to an inadequate volume of blood received in culture bottles   Culture   Final    NO GROWTH 5 DAYS Performed at [facility], [place] [address], [place], [place] [address]    Report Status 08/20/2019 FINAL  Final  Blood culture (routine x 2)     Status: None   Collection Time: 08/15/19  5:42 PM   Specimen: BLOOD  Result Value Ref Range Status   Specimen Description BLOOD RIGHT ANTECUBITAL  Final   Special Requests   Final    BOTTLES DRAWN AEROBIC AND ANAEROBIC Blood Culture results may not be optimal due to an excessive volume of blood received in culture bottles   Culture   Final    NO GROWTH 5 DAYS Performed at [facility], [place] [address], [place], [place] [address]    Report  Status 08/20/2019 FINAL  Final  Blood culture (routine x 2)     Status: None   Collection Time: 08/17/19 11:36 AM   Specimen: BLOOD  Result Value Ref Range Status   Specimen Description BLOOD LEFT ANTECUBITAL  Final   Special Requests   Final    BOTTLES DRAWN AEROBIC AND ANAEROBIC Blood Culture adequate volume   Culture   Final    NO GROWTH 5 DAYS Performed at [facility], [place] [address], [place], [place] [address]    Report Status 08/22/2019 FINAL  Final  Blood culture (routine x 2)     Status: None   Collection Time: 08/17/19 12:04 PM   Specimen: BLOOD RIGHT HAND  Result Value Ref Range Status   Specimen Description BLOOD RIGHT HAND  Final   Special Requests   Final    BOTTLES DRAWN AEROBIC ONLY Blood Culture results may not be optimal due to an inadequate volume of blood received in culture bottles   Culture   Final    NO GROWTH 5 DAYS Performed at [facility], [place] [address] [place], [place] [address]    Report Status 08/22/2019 FINAL  Final         Radiology Studies: No results found.      Scheduled Meds: . sodium chloride   Intravenous Once  . albuterol  2 puff Inhalation Q6H  . amLODipine  5 mg Oral Daily  . [START ON 08/24/2019] dexamethasone  6 mg Oral Daily  . enoxaparin (LOVENOX) injection  70 mg Subcutaneous Q12H  . famotidine  20 mg Oral BID  . feeding supplement (ENSURE ENLIVE)  237 mL Oral BID BM  . finasteride  5 mg Oral Daily  . levothyroxine  150 mcg Oral Q0600  . rosuvastatin  10 mg Oral q1800  . senna-docusate  1 tablet Oral BID  . tamsulosin  0.4 mg Oral Daily   Continuous Infusions:    LOS: 6  days    Time spent: 30 minutes    Barb Merino, MD [facility] Pager [phone]

## 2019-08-23 NOTE — Progress Notes (Signed)
Occupational Therapy Evaluation  Clinical Impression: PTA pt was living with his wife, independent in all ADLs, IADLs, and mobility. 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 Pt does not use oxygen at home and is currently on 5L [place]. Pt currently independent to min guard for all self-care and functional transfer tasks. Pt able to ambulate to/from bathroom to complete toileting and grooming/hygiene tasks at the sink. Noted several instances of loss of balance with pt able to self-correct. Pt's SpO2 decreased to 85% (finger probe) on 6L during mobility with quick return back to 90s following seated rest break. 2/4 DOE. Changed pt from finger probe to ear probe with better reading noted from ear probe. Pt titrated down to 4L at end of session with SpO2 at 96% (ear probe). Pt demonstrates decreased strength, endurance, balance, standing tolerance, and activity tolerance impacting ability to complete self-care and functional transfer tasks. Recommend skilled OT services to address above deficits in order to promote function and prevent further decline. No additional OT follow up following hospital discharge.     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  Patient started having symptoms of fever and cough at around Thanksgiving, 11/26, tested positive about 1 week ago.  He started developing more shortness of breath, on 12/6 was seen in the emergency room, no hypoxia on ambulation, sent home with azithromycin.  Came back with worsening shortness of breath and hypoxia.  In the emergency room, his saturations were 60% and he started 100% nonrebreather.  Chest  x-ray showed multifocal pneumonia.  Admited to the hospital with multifocal pneumonia due to COVID-19 with worsening hypoxemia.  Precautions  Precautions Fall  Restrictions  Weight Bearing Restrictions No  Home Living  Family/patient expects to be discharged to: Private residence  Living Arrangements Spouse/significant other  Available Help at Discharge Family  Type of [place] to enter  Home Layout Two level;Laundry or work area in basement;Bed/bath Sports coach None  Additional Comments Pt has an Media planner  Prior Function  Level of Independence Independent  Comments Pt independent with ADLs, IADLs, and mobility. 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 Pt reports 0 falls in the last 6 months. Pt does not use oxygen at home.  Communication  Communication No difficulties  Pain Assessment  Pain Assessment 0-10  Pain Score  (11-12/10 pain)  Pain Location chest pain due to deep breath and coughing  Pain Descriptors / Indicators Aching;Burning  Pain Intervention(s) Monitored during session;Repositioned;Limited activity within patient's tolerance  Cognition  Arousal/Alertness Awake/alert  Behavior During Therapy WFL for tasks assessed/performed  Overall Cognitive Status Within Functional Limits for tasks assessed  Upper Extremity Assessment  Upper Extremity Assessment Generalized weakness  Lower Extremity Assessment  Lower Extremity Assessment Defer to PT evaluation  ADL  Overall ADL's  Needs assistance/impaired  Eating/Feeding Independent;Sitting  Grooming Supervision/safety;Min guard;Standing  Upper Body Bathing Supervision/ safety;Min guard;Standing  Lower Body Bathing Min guard;Supervison/ safety;Sit to/from stand;Sitting/lateral leans  Upper Body Dressing  Set up;Sitting  Lower Body Dressing Supervision/safety;Min guard;Sit to/from stand;Sitting/lateral Nurse, learning disability Min guard;Ambulation;Regular Toilet;Grab bars  Toileting- Clothing  Manipulation and Hygiene Min guard;Supervision/safety;Sitting/lateral lean;Sit to/from stand  Functional mobility during ADLs Min guard;Supervision/safety  Bed Mobility  Overal bed mobility Independent  Transfers  Overall transfer level Needs assistance  Equipment used None  Transfers Sit to/from Stand  Sit to Stand Supervision  General transfer comment Pt able to ambulate to/from bathroom with variable supervision to min guard. Noted several instances of LOB with pt able to self-correct.  Balance  Overall balance assessment Mild deficits observed, not formally tested  General Comments  General comments (skin integrity, edema, etc.) Pt on 5L [place] at start of session. SpO2 decreased to 85% on 6L during activity. Pt titrated down to 4L at end of session with SpO2 at 96%. Educated pt on safety strategies, energy conservation, and activity modifications.   Exercises  Exercises Other exercises  Other Exercises  Other Exercises Pt declined using incentive spirometer as it increases pain in chest.  Other Exercises Flutter valve x 10  Other Exercises Pursed lip breathing throughout  OT - End of Session  Equipment Utilized During Treatment Oxygen  Activity Tolerance Patient limited by fatigue;Patient limited by pain (Limited by SOB)  Patient left in chair;with call bell/phone within reach  Nurse Communication Mobility status  OT Assessment  OT Recommendation/Assessment Patient needs continued OT Services  OT Visit Diagnosis Unsteadiness on feet (R26.81);Muscle weakness (generalized) (M62.81)  OT Problem List Decreased strength;Decreased activity tolerance;Impaired balance (sitting and/or standing);Cardiopulmonary status limiting activity  OT Plan  OT Frequency (ACUTE ONLY) Min 3X/week  OT Treatment/Interventions (ACUTE ONLY) Self-care/ADL training;Therapeutic exercise;Neuromuscular education;Energy  conservation;DME and/or AE instruction;Therapeutic activities;Patient/family education;Balance training  AM-PAC OT "6 Clicks" Daily Activity Outcome Measure (Version 2)  Help from another person eating meals? 4  Help from another person taking care of personal grooming? 3  Help from another person toileting, which includes using toliet, bedpan, or urinal? 3  Help from another person bathing (including washing, rinsing, drying)? 3  Help from another person to put on and taking off regular upper body clothing? 3  Help from another person to put on and taking off regular lower body clothing? 3  6 Click Score 19  OT Recommendation  Follow Up Recommendations No OT follow up  OT Equipment Other (comment) (Pt may require shower chair for home, depending on progress)  Acute Rehab OT Goals  Patient Stated Goal To go home  Time For Goal Achievement 09/06/19  Potential to Achieve Goals Good  OT Time Calculation  OT Start Time (ACUTE ONLY) 0850  OT Stop Time (ACUTE ONLY) 0920  OT Time Calculation (min) 30 min  OT General Charges  $OT Visit 1 Visit  OT Evaluation  $OT Eval Moderate Complexity 1 Mod  Written Expression  Dominant Hand Right    Mauri [place] OTR/L [phone]

## 2019-08-23 NOTE — Evaluation (Signed)
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  Patient started having symptoms of fever and cough at around Thanksgiving, 11/26, tested positive about 1 week ago.  He started developing more shortness of breath, on 12/6 was seen in the emergency room, no hypoxia on ambulation, sent home with azithromycin.  Came back with worsening shortness of breath and hypoxia.  In the emergency room, his saturations were 60% and he started 100% nonrebreather.  Chest x-ray showed multifocal pneumonia.  Admited to the hospital with multifocal pneumonia due to COVID-19 with worsening hypoxemia.  Clinical Impression  The patient presents with mild  balance deficits when ambulating, should improve with increased mobility. Patient ambulated on 5 L [place] with SPO2 85% on finger. Changed to ear with SPO2 98%, decreased to 4 L [place] with SPO2 >955. Patient should progress to Dc home.Pt admitted with above diagnosis. Pt currently with functional limitations due to the deficits listed below (see PT Problem List). Pt will benefit from skilled PT to increase their independence and safety with mobility to allow discharge to the venue listed below.       Follow Up Recommendations No PT follow up    Equipment Recommendations  None recommended by PT    Recommendations for Other Services       Precautions / Restrictions Precautions Precautions: Fall Precaution Comments: try to wean O2      Mobility  Bed Mobility Overal bed mobility: Independent                Transfers   Equipment used: None Transfers: Sit to/from Stand Sit to Stand: Supervision         General transfer comment: min guard, tending to lean and reach to steady self upon standing  Ambulation/Gait Ambulation/Gait assistance: Min assist Gait Distance  (Feet): 50 Feet Assistive device: None;1 person hand held assist Gait Pattern/deviations: Step-through pattern;Staggering right;Staggering left;Drifts right/left Gait velocity: decr   General Gait Details: multiple episodes of imbalance, steady assist required.  Stairs            Wheelchair Mobility    Modified Rankin (Stroke Patients Only)       Balance Overall balance assessment: Needs assistance Sitting-balance support: No upper extremity supported Sitting balance-Leahy Scale: Normal     Standing balance support: During functional activity;No upper extremity supported Standing balance-Leahy Scale: Fair Standing balance comment: drifting and stagering at times                             Pertinent Vitals/Pain Pain Assessment: Faces Faces Pain Scale: Hurts a little bit Pain Location: chest pain due to deep breath and coughing Pain Descriptors / Indicators: Aching;Burning Pain Intervention(s): Monitored during session    Home Living Family/patient expects to be discharged to:: Private residence Living Arrangements: Spouse/significant other Available Help at Discharge: Family Type of Home: House Home Access: Stairs to enter     Home Layout: Two level;Laundry or work area in Lobbyist: None Additional Comments: Pt has an Media planner    Prior Function Level of Independence: Independent         Comments: Pt independent with ADLs, IADLs, and mobility. 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 Pt reports 0 falls in the last 6 months. Pt does not use oxygen  at home.     Hand Dominance        Extremity/Trunk Assessment   Upper Extremity Assessment Upper Extremity Assessment: Generalized weakness    Lower Extremity Assessment Lower Extremity Assessment: Generalized weakness    Cervical / Trunk Assessment Cervical / Trunk Assessment: Normal  Communication      Cognition Arousal/Alertness:  Awake/alert Behavior During Therapy: WFL for tasks assessed/performed Overall Cognitive Status: Within Functional Limits for tasks assessed                                        General Comments      Exercises Other Exercises Other Exercises: Pt declined using incentive spirometer as it increases pain in chest. Other Exercises: Flutter valve x 10 Other Exercises: Pursed lip breathing throughout   Assessment/Plan    PT Assessment Patient needs continued PT services  PT Problem List Decreased mobility;Decreased knowledge of precautions;Decreased activity tolerance;Decreased balance;Decreased knowledge of use of DME;Cardiopulmonary status limiting activity       PT Treatment Interventions DME instruction;Therapeutic activities;Gait training;Therapeutic exercise;Functional mobility training;Patient/family education    PT Goals (Current goals can be found in the Care Plan section)  Acute Rehab PT Goals Patient Stated Goal: To go home PT Goal Formulation: With patient Time For Goal Achievement: 09/06/19 Potential to Achieve Goals: Good    Frequency Min 3X/week   Barriers to discharge        Co-evaluation               AM-PAC PT "6 Clicks" Mobility  Outcome Measure Help needed turning from your back to your side while in a flat bed without using bedrails?: None Help needed moving from lying on your back to sitting on the side of a flat bed without using bedrails?: None Help needed moving to and from a bed to a chair (including a wheelchair)?: A Little Help needed standing up from a chair using your arms (e.g., wheelchair or bedside chair)?: A Little Help needed to walk in hospital room?: A Little Help needed climbing 3-5 steps with a railing? : A Lot 6 Click Score: 19    End of Session Equipment Utilized During Treatment: Oxygen Activity Tolerance: Patient tolerated treatment well Patient left: in chair;with call bell/phone within reach Nurse  Communication: Mobility status PT Visit Diagnosis: Unsteadiness on feet (R26.81)    [id number] PT Time Calculation (min) (ACUTE ONLY): 33 min   Charges:   PT Evaluation $PT Eval Low Complexity: [place] Pager [phone] Office [phone]   Claretha Cooper 08/23/2019, 4:22 PM

## 2019-08-23 NOTE — Progress Notes (Signed)
ANTICOAGULATION CONSULT NOTE - Follow-Up Consult  Pharmacy Consult for Lovenox Indication: r/o VTE with d-dimer > 20  No Known Allergies  Patient Measurements: Height: 5\' 6"  (167.6 cm) Weight: 160 lb (72.6 kg) IBW/kg (Calculated) : 63.8  Vital Signs: Temp: 98.4 F (36.9 C) (12/14 1200) Temp Source: Oral (12/14 1200) BP: 107/80 (12/14 1200) Pulse Rate: 76 (12/14 1200)  Labs: Recent Labs    08/21/19 0000 08/22/19 0528 08/22/19 1220 08/23/19 0238  HGB 12.9*  --  13.0 12.6*  HCT 38.2*  --  38.6* 37.9*  PLT 387  --  436* 394  CREATININE 0.70 0.74  --  0.76    Estimated Creatinine Clearance: 74.2 mL/min (by C-G formula based on SCr of 0.76 mg/dL).   Medical History: Past Medical History:  Diagnosis Date  . BPH (benign prostatic hyperplasia)   . High cholesterol   . Hypertension   . Hypothyroidism     Medications:  Scheduled:  . sodium chloride   Intravenous Once  . albuterol  2 puff Inhalation Q6H  . amLODipine  5 mg Oral Daily  . [START ON 08/24/2019] dexamethasone  6 mg Oral Daily  . enoxaparin (LOVENOX) injection  70 mg Subcutaneous Q12H  . famotidine  20 mg Oral BID  . feeding supplement (ENSURE ENLIVE)  237 mL Oral BID BM  . finasteride  5 mg Oral Daily  . levothyroxine  150 mcg Oral Q0600  . rosuvastatin  10 mg Oral q1800  . senna-docusate  1 tablet Oral BID  . tamsulosin  0.4 mg Oral Daily    Assessment: 73 y.o. M presents to hospital with COVID 19. Pt currently on Lovenox 40mg  daily VTE prophylactic dose - last dose 12/10 1145. D-dimer now >20 and MD consulted pharmacy to dose full dose Lovenox for anticoagulation.   Renal function and CBC stable. No active bleeding noted. Current dose remains appropriate.   Goal of Therapy:  Anti-Xa level 0.6-1 units/ml 4hrs after LMWH dose given Monitor platelets by anticoagulation protocol: Yes   Plan:  - Continue Lovenox 70 mg SQ every 12 hours - CBC q72h - Will monitor renal function, sign/symptoms of  bleeding, LOT  Thank you for allowing pharmacy to be a part of this patient's care.  Alycia Rossetti, PharmD, BCPS Clinical Pharmacist Clinical phone for 08/23/2019: [phone] 08/23/2019 1:40 PM   **Pharmacist phone directory can now be found on [email or web address] ([email or web address]).  Listed under [place].

## 2019-08-23 NOTE — Progress Notes (Signed)
[facility] PCCM  Chart reviewed, O2 needs improved PCCM available prn  Roselie Awkward, MD [place] Pager: [phone] Cell: [phone] If no response, call [phone]

## 2019-08-24 LAB — D-DIMER, QUANTITATIVE: D-Dimer, Quant: 11.61 ug/mL-FEU — ABNORMAL HIGH (ref 0.00–0.50)

## 2019-08-24 LAB — COMPREHENSIVE METABOLIC PANEL
ALT: 69 U/L — ABNORMAL HIGH (ref 0–44)
AST: 33 U/L (ref 15–41)
Albumin: 2.7 g/dL — ABNORMAL LOW (ref 3.5–5.0)
Alkaline Phosphatase: 44 U/L (ref 38–126)
Anion gap: 10 (ref 5–15)
BUN: 34 mg/dL — ABNORMAL HIGH (ref 8–23)
CO2: 23 mmol/L (ref 22–32)
Calcium: 7.7 mg/dL — ABNORMAL LOW (ref 8.9–10.3)
Chloride: 106 mmol/L (ref 98–111)
Creatinine, Ser: 0.64 mg/dL (ref 0.61–1.24)
GFR calc Af Amer: >60 mL/min (ref >60)
GFR calc non Af Amer: >60 mL/min (ref >60)
Glucose, Bld: 90 mg/dL (ref 70–99)
Potassium: 4 mmol/L (ref 3.5–5.1)
Sodium: 139 mmol/L (ref 135–145)
Total Bilirubin: 1.1 mg/dL (ref 0.3–1.2)
Total Protein: 5.7 g/dL — ABNORMAL LOW (ref 6.5–8.1)

## 2019-08-24 LAB — C-REACTIVE PROTEIN: CRP: 0.6 mg/dL (ref <1.0)

## 2019-08-24 MED ORDER — DEXAMETHASONE 2 MG PO TABS: ORAL_TABLET | ORAL | 0 refills | Status: DC

## 2019-08-24 MED ORDER — ASPIRIN EC 325 MG PO TBEC: 325.0000 mg | DELAYED_RELEASE_TABLET | Freq: Every day | ORAL | 0 refills | Status: AC

## 2019-08-24 MED ORDER — SALINE SPRAY 0.65 % NA SOLN
1.0000 | NASAL | Status: DC | PRN
  Filled 2019-08-24: qty 44

## 2019-08-24 MED ORDER — ENSURE ENLIVE PO LIQD
237.0000 mL | Freq: Three times a day (TID) | ORAL | Status: DC
  Administered 2019-08-25: 237 mL via ORAL

## 2019-08-24 NOTE — Progress Notes (Addendum)
ANTICOAGULATION CONSULT NOTE - Follow-Up Consult  Pharmacy Consult for Lovenox Indication: r/o VTE with d-dimer > 20  No Known Allergies  Patient Measurements: Height: 5\' 6"  (167.6 cm) Weight: 160 lb (72.6 kg) IBW/kg (Calculated) : 63.8  Vital Signs: Temp: 98.4 F (36.9 C) (12/15 0718) Temp Source: Oral (12/15 0718) BP: 105/71 (12/15 0718) Pulse Rate: 76 (12/15 0718)  Labs: Recent Labs    08/22/19 0528 08/22/19 1220 08/23/19 0238 08/24/19 0244  HGB  --  13.0 12.6*  --   HCT  --  38.6* 37.9*  --   PLT  --  436* 394  --   CREATININE 0.74  --  0.76 0.64    Estimated Creatinine Clearance: 74.2 mL/min (by C-G formula based on SCr of 0.64 mg/dL).   Medical History: Past Medical History:  Diagnosis Date  . BPH (benign prostatic hyperplasia)   . High cholesterol   . Hypertension   . Hypothyroidism     Medications:  Scheduled:  . sodium chloride   Intravenous Once  . albuterol  2 puff Inhalation Q6H  . amLODipine  5 mg Oral Daily  . dexamethasone  6 mg Oral Daily  . enoxaparin (LOVENOX) injection  70 mg Subcutaneous Q12H  . famotidine  20 mg Oral BID  . feeding supplement (ENSURE ENLIVE)  237 mL Oral BID BM  . finasteride  5 mg Oral Daily  . levothyroxine  150 mcg Oral Q0600  . rosuvastatin  10 mg Oral q1800  . senna-docusate  1 tablet Oral BID  . tamsulosin  0.4 mg Oral Daily    Assessment: 73 y.o. M presented to hospital with COVID 19. Pt was initially placed on Lovenox 40mg  daily VTE prophylactic dose; on 12/11 D-dimer >20 and MD consulted pharmacy for full dose Lovenox.  Dosage was then increased to 70 mg (~1mg /kg) q12h.    12/11: CT angiogram negative for PE; bilateral LE venous doppler negative for DVT   Today (08/24/2019): Renal function and CBC stable.  No active bleeding noted.  Remains on full therapeutic-dose Lovenox.   Goal of Therapy:  Anti-Xa level 0.6-1 units/ml 4hrs after LMWH dose given Monitor platelets by anticoagulation protocol:  Yes   Plan:  - Continue Lovenox 70 mg SQ every 12 hours - CBC q72h - Will monitor renal function, any reports of bleeding, LOT  Thank you for allowing pharmacy to be a part of this patient's care.  Clayburn Pert, PharmD, BCPS 08/24/2019  10:52 AM

## 2019-08-24 NOTE — Progress Notes (Signed)
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  Patient started having symptoms of fever and cough at around Thanksgiving, 11/26, tested positive about 1 week ago.  He started developing more shortness of breath, on 12/6 was seen in the emergency room, no hypoxia on ambulation, sent home with azithromycin.  Came back with worsening shortness of breath and hypoxia.  In the emergency room, his saturations were 60% and he was started 100% nonrebreather.  Chest x-ray showed multifocal pneumonia.  Admited to the hospital with multifocal pneumonia due to COVID-19 with worsening hypoxemia. 08/20/2019: Remained persistently hypoxemic on 100% nonrebreather. D-dimer elevated more than 20.  Started on Lovenox. 08/22/2019: Clinically improving and able to wean off to the 10 L of oxygen via nasal cannula.  Appetite improving. 08/23/2019: improving oxygenation gradually. 08/24/2019: On 1 to 2 L of oxygen with good clinical improvement.  Assessment & Plan:   Active Problems:   Pneumonia due to COVID-19 virus   Essential hypertension   Hypothyroidism   HLD (hyperlipidemia)   BPH (benign prostatic hyperplasia)  Pneumonia due to COVID-19 virus, acute respiratory disease due to COVID-19 with acute hypoxemic respiratory failure: Oxygen requirement continue to improve. Continue chest physiotherapy, incentive spirometry, deep breathing exercises, sputum induction, mucolytic's and bronchodilators.   Continue mobility.   Supplemental oxygen to keep saturations more than 85%. Covid directed therapy with , steroids on dexamethasone, change to oral dexamethasone.  remdesivir finished 5 days of therapy. actemra dose on 12/8, repeat dose on 12/9 convalescent plasma ,12/9, 1 unit received.  antibiotics not indicated.  Procalcitonin  normal. D-dimer was elevated more than 20, on therapeutic Lovenox.  Tolerating.  Chest CTA and lower extremity duplexes with no evidence of thromboembolism.  Treated with intermittent Lasix.  With good clinical response.  Hypertension: Blood pressure stable.  He is on amlodipine and Terazosin.  Resumed.  Hyperlipidemia: On a statin at home.  Continue.  BPH: On Flomax that he will continue.   Hypothyroidism: Resumed home thyroxine.  TSH normal.   Discharge planning:  Mobilize.  PT OT.  Continue to wean off oxygen. Evaluate for supplemental home oxygen need.  Discontinued cardiac telemetry, can transfer to MedSurg bed. Patient had some deficit in mobility, he will benefit with PT at home.  He has adequate support at home, wants to go home with home health PT and home health aide. Discussed with PCCM regarding long-term anticoagulation plan and steroids. Anticoagulation, no data available of additional benefit with long-term anticoagulation, I discussed with patient, would suggest aspirin 325 mg daily on discharge. Steroids, with significant ARDS, may benefit with prolonged taper for 3 weeks.  DVT prophylaxis: Therapeutic Lovenox. Code Status: Full code Family Communication: Patient's wife, Dr. Toy Care. Discussed discharge planning. Disposition Plan: mobility, remains in the hospital. Anticipate home with home PT and home health aid.   Consultants:   PCCM  Procedures:   None  Antimicrobials:   Remdesivir, 08/17/2019--- 08/21/2019   Subjective: No overnight events. Wants to walk. Wants to see if he need oxygen at home.   Objective: Vitals:   08/23/19 2300 08/24/19 0000 08/24/19 0438 08/24/19 0718  BP: 125/75 125/75 125/74 105/71  Pulse: 65 67 64 76  Resp: 14 15 20  (!) 21  Temp: 98.1 F (36.7 C)  98.3 F (36.8 C) 98.4 F (36.9 C)  TempSrc: Oral  Oral Oral  SpO2: 100% 93% 94% 94%  Weight:      Height:        Intake/Output Summary (Last 24 hours) at 08/24/2019 1207 Last  data filed at 08/24/2019 0719 Gross per 24 hour  Intake --  Output 1050 ml  Net -1050 ml   Filed Weights   08/17/19 0939  Weight: 72.6 kg    Examination:  Physical Exam  Constitutional: He is oriented to person, place, and time.  Not in any distress.  Comfortably resting in bed. On 1-2 liters of oxygen.  HENT:  Head: Normocephalic and atraumatic.  Eyes: Pupils are equal, round, and reactive to light.  Cardiovascular: Normal rate and regular rhythm. Exam reveals no friction rub.  Pulmonary/Chest:  Not in any respiratory distress.  On 2L nasal canula. Occasional fine crackles on expiration.   Abdominal: Bowel sounds are normal. He exhibits no distension.  Musculoskeletal:     Cervical back: Neck supple.  Neurological: He is alert and oriented to person, place, and time.  Psychiatric: He has a normal mood and affect.      Data Reviewed: I have personally reviewed following labs and imaging studies  CBC: Recent Labs  Lab 08/19/19 0158 08/20/19 0110 08/21/19 0000 08/22/19 1220 08/23/19 0238  WBC 7.5 7.8 9.6 11.1* 9.9  NEUTROABS  --   --   --   --  8.0*  HGB 11.1* 11.3* 12.9* 13.0 12.6*  HCT 33.1* 33.8* 38.2* 38.6* 37.9*  MCV 88.7 88.9 88.4 88.9 90.7  PLT 366 353 387 436* [id number]   Basic Metabolic Panel: Recent Labs  Lab 08/20/19 0110 08/21/19 0000 08/21/19 1145 08/22/19 0528 08/23/19 0238 08/24/19 0244  NA 140 139  --  139 142 139  K 3.9 3.7  --  4.8 4.1 4.0  CL 107 102  --  104 106 106  CO2 23 24  --  23 24 23   GLUCOSE 114* 111*  --  79 101* 90  BUN 30* 29*  --  36* 44* 34*  CREATININE 0.63 0.70  --  0.74 0.76 0.64  CALCIUM 7.8* 7.8*  --  7.9* 8.0* 7.7*  MG  --  2.3 2.2  --  2.3  --   PHOS  --  4.0 4.0  --  4.1  --    GFR: Estimated Creatinine Clearance: 74.2 mL/min (by C-G formula based on SCr of 0.64 mg/dL). Liver Function Tests: Recent Labs  Lab 08/20/19 0110 08/21/19 0000 08/22/19 0528 08/23/19 0238 08/24/19 0244  AST 52* 58* 50* 40 33  ALT  66* 85* 65* 71* 69*  ALKPHOS 52 57 59 50 44  BILITOT 0.6 1.0 1.6* 0.7 1.1  PROT 5.8* 6.2* 5.8* 5.9* 5.7*  ALBUMIN 2.4* 2.7* 2.6* 2.7* 2.7*   No results for input(s): LIPASE, AMYLASE in the last 168 hours. No results for input(s): AMMONIA in the last 168 hours. Coagulation Profile: No results for input(s): INR, PROTIME in the last 168 hours. Cardiac Enzymes: No results for input(s): CKTOTAL, CKMB, CKMBINDEX, TROPONINI in the last 168 hours. BNP (last 3 results) No results for input(s): PROBNP in the last 8760 hours. HbA1C: No results for input(s): HGBA1C in the last 72 hours. CBG: Recent Labs  Lab 08/20/19 0709 08/20/19 1145 08/20/19 2041 08/21/19 0722 08/21/19 1150  GLUCAP 105* 137* 126* 93 123*   Lipid Profile: No results for input(s): CHOL, HDL, LDLCALC, TRIG, CHOLHDL, LDLDIRECT in the last 72 hours. Thyroid Function Tests: No results for input(s): TSH, T4TOTAL, FREET4, T3FREE, THYROIDAB  in the last 72 hours. Anemia Panel: Recent Labs    08/22/19 0528  FERRITIN 298   Sepsis Labs: Recent Labs  Lab 08/17/19 1345 08/20/19 0110 08/21/19 0000  PROCALCITON <0.10 <0.10 <0.10    Recent Results (from the past 240 hour(s))  Blood culture (routine x 2)     Status: None   Collection Time: 08/15/19  5:40 PM   Specimen: BLOOD  Result Value Ref Range Status   Specimen Description BLOOD LEFT ANTECUBITAL  Final   Special Requests   Final    BOTTLES DRAWN AEROBIC AND ANAEROBIC Blood Culture results may not be optimal due to an inadequate volume of blood received in culture bottles   Culture   Final    NO GROWTH 5 DAYS Performed at [facility], [place] [address], [place], [place] [address]    Report Status 08/20/2019 FINAL  Final  Blood culture (routine x 2)     Status: None   Collection Time: 08/15/19  5:42 PM   Specimen: BLOOD  Result Value Ref Range Status   Specimen Description BLOOD RIGHT ANTECUBITAL  Final   Special Requests   Final    BOTTLES DRAWN AEROBIC  AND ANAEROBIC Blood Culture results may not be optimal due to an excessive volume of blood received in culture bottles   Culture   Final    NO GROWTH 5 DAYS Performed at [facility], [place] [address], [place], [place] [address]    Report Status 08/20/2019 FINAL  Final  Blood culture (routine x 2)     Status: None   Collection Time: 08/17/19 11:36 AM   Specimen: BLOOD  Result Value Ref Range Status   Specimen Description BLOOD LEFT ANTECUBITAL  Final   Special Requests   Final    BOTTLES DRAWN AEROBIC AND ANAEROBIC Blood Culture adequate volume   Culture   Final    NO GROWTH 5 DAYS Performed at [facility], [place] [address] [place], [place] [address]    Report Status 08/22/2019 FINAL  Final  Blood culture (routine x 2)     Status: None   Collection Time: 08/17/19 12:04 PM   Specimen: BLOOD RIGHT HAND  Result Value Ref Range Status   Specimen Description BLOOD RIGHT HAND  Final   Special Requests   Final    BOTTLES DRAWN AEROBIC ONLY Blood Culture results may not be optimal due to an inadequate volume of blood received in culture bottles   Culture   Final    NO GROWTH 5 DAYS Performed at [facility], [place] [address] [place], [place] [address]    Report Status 08/22/2019 FINAL  Final         Radiology Studies: No results found.      Scheduled Meds: . sodium chloride   Intravenous Once  . albuterol  2 puff Inhalation Q6H  . amLODipine  5 mg Oral Daily  . dexamethasone  6 mg Oral Daily  . enoxaparin (LOVENOX) injection  70 mg Subcutaneous Q12H  . famotidine  20 mg Oral BID  . feeding supplement (ENSURE ENLIVE)  237 mL Oral BID BM  . finasteride  5 mg Oral Daily  . levothyroxine  150 mcg Oral Q0600  . rosuvastatin  10 mg Oral q1800  . senna-docusate  1 tablet Oral BID  . tamsulosin  0.4 mg Oral Daily   Continuous Infusions:    LOS: 7 days    Time spent: 30 minutes  Barb Merino, MD [facility] Pager [phone]

## 2019-08-24 NOTE — Plan of Care (Signed)
O2 criteria test completed and documented; Dr. Sloan Leiter aware. Possible disharge tomorrow. Pt updated family onplan of care. Pt up in chair satting low 90s on room air.  Problem: Education: Goal: Knowledge of General Education information will improve Description: Including pain rating scale, medication(s)/side effects and non-pharmacologic comfort measures Outcome: Progressing   Problem: Health Behavior/Discharge Planning: Goal: Ability to manage health-related needs will improve Outcome: Progressing   Problem: Clinical Measurements: Goal: Ability to maintain clinical measurements within normal limits will improve Outcome: Progressing Goal: Will remain free from infection Outcome: Progressing Goal: Diagnostic test results will improve Outcome: Progressing Goal: Respiratory complications will improve Outcome: Progressing Goal: Cardiovascular complication will be avoided Outcome: Progressing

## 2019-08-24 NOTE — Progress Notes (Signed)
Nutrition Follow-up  DOCUMENTATION CODES:   Not applicable  INTERVENTION:   Increase Ensure Enlive po to TID, each supplement provides 350 kcal and 20 grams of protein  Continue Regular diet   Obtain new weight  NUTRITION DIAGNOSIS:   Increased nutrient needs related to catabolic [id number]) as evidenced by estimated needs.  Being addressed via supplements  GOAL:   Patient will meet greater than or equal to 90% of their needs  Progressing  MONITOR:   PO intake, Supplement acceptance, Labs, Weight trends, I & O's  REASON FOR ASSESSMENT:   Malnutrition Screening Tool    ASSESSMENT:   73 year old male with PMHx of HTN, HLD, hypothyroidism, BPH admitted with COVID-19 PNA.   RD working remotely.  Recorded po intake 0-20% of meals; pt receiving Ensure Enlive, 1-2 per day. Spoke with RN who indicates that pt reports appetite is starting to come back, he ate some breakfast and lunch today. Pt also drank 1 Ensure this AM, the other is at bedside at the moment. Family is not brining in any food per RN  No new weights since admission  Labs: reviewed Meds: decadron   Diet Order:   Diet Order            Diet regular Room service appropriate? Yes; Fluid consistency: Thin  Diet effective now              EDUCATION NEEDS:   No education needs have been identified at this time  Skin:  Skin Assessment: Reviewed RN Assessment  Last BM:  12/14  Height:   Ht Readings from Last 1 Encounters:  08/17/19 5\' 6"  (1.676 m)    Weight:   Wt Readings from Last 1 Encounters:  08/17/19 72.6 kg    Ideal Body Weight:  64.5 kg  BMI:  Body mass index is 25.82 kg/m.  Estimated Nutritional Needs:   Kcal:  1850-2150  Protein:  93-108 grams  Fluid:  1.8 L/day   Kerman Passey MS, RDN, LDN, CNSC [phone] Pager  [phone] Weekend/On-Call Pager

## 2019-08-24 NOTE — Progress Notes (Signed)
SATURATION QUALIFICATIONS: (This note is used to comply with regulatory documentation for home oxygen)  Patient Saturations on Room Air at Rest = 90%  Patient Saturations on Room Air while Ambulating = 82%-86%  Patient Saturations on 2 Liters of oxygen while Ambulating = 88%  Please briefly explain why patient needs home oxygen: Patient desats to low 80s on room air. Oxygenation improved to 88-89% on 2L [place]. Sats immediately return to 90% on 2L [place] at rest, and 91% on RA within a few minutes.  Patient ambulated approx. 120 feet.

## 2019-08-25 LAB — COMPREHENSIVE METABOLIC PANEL
ALT: 60 U/L — ABNORMAL HIGH (ref 0–44)
AST: 35 U/L (ref 15–41)
Albumin: 2.6 g/dL — ABNORMAL LOW (ref 3.5–5.0)
Alkaline Phosphatase: 49 U/L (ref 38–126)
Anion gap: 10 (ref 5–15)
BUN: 29 mg/dL — ABNORMAL HIGH (ref 8–23)
CO2: 20 mmol/L — ABNORMAL LOW (ref 22–32)
Calcium: 7.7 mg/dL — ABNORMAL LOW (ref 8.9–10.3)
Chloride: 110 mmol/L (ref 98–111)
Creatinine, Ser: 0.83 mg/dL (ref 0.61–1.24)
GFR calc Af Amer: >60 mL/min (ref >60)
GFR calc non Af Amer: >60 mL/min (ref >60)
Glucose, Bld: 140 mg/dL — ABNORMAL HIGH (ref 70–99)
Potassium: 3.5 mmol/L (ref 3.5–5.1)
Sodium: 140 mmol/L (ref 135–145)
Total Bilirubin: 0.8 mg/dL (ref 0.3–1.2)
Total Protein: 5.7 g/dL — ABNORMAL LOW (ref 6.5–8.1)

## 2019-08-25 LAB — D-DIMER, QUANTITATIVE: D-Dimer, Quant: 8.41 ug/mL-FEU — ABNORMAL HIGH (ref 0.00–0.50)

## 2019-08-25 LAB — C-REACTIVE PROTEIN: CRP: 0.6 mg/dL (ref <1.0)

## 2019-08-25 NOTE — Plan of Care (Signed)

## 2019-08-25 NOTE — Progress Notes (Signed)
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  Patient started having symptoms of fever and cough at around Thanksgiving, 11/26, tested positive about 1 week ago.  He started developing more shortness of breath, on 12/6 was seen in the emergency room, no hypoxia on ambulation, sent home with azithromycin.  Came back with worsening shortness of breath and hypoxia.  In the emergency room, his saturations were 60% and he was started 100% nonrebreather.  Chest x-ray showed multifocal pneumonia.  Admited to the hospital with multifocal pneumonia due to COVID-19 with worsening hypoxemia. 08/20/2019: Remained persistently hypoxemic on 100% nonrebreather. D-dimer elevated more than 20.  Started on Lovenox. 08/22/2019: Clinically improving and able to wean off to the 10 L of oxygen via nasal cannula.  Appetite improving. 08/23/2019: improving oxygenation gradually. 08/24/2019: On 1 to 2 L of oxygen with good clinical improvement.  Assessment & Plan:   Active Problems:   Pneumonia due to COVID-19 virus   Essential hypertension   Hypothyroidism   HLD (hyperlipidemia)   BPH (benign prostatic hyperplasia)  Pneumonia due to COVID-19 virus, acute respiratory disease due to COVID-19 with acute hypoxemic respiratory failure: Oxygen requirement continue to improve - still somewhat hypoxic/symptomatic with minimal exertion SpO2: 93 % O2 Flow Rate (L/min): 2 L/min FiO2 (%): 100 % Continue chest physiotherapy, incentive spirometry, deep breathing exercises, sputum induction, mucolytic's and bronchodilators.   Continue mobility.   Covid directed therapy with, steroids on dexamethasone, change to oral dexamethasone Remdesivir completed 5 days of therapy. Actemra dose on 12/8, repeat dose on 12/9 Convalescent plasma ,12/9, 1 unit  received.  Antibiotics not indicated.  Procalcitonin normal. D-dimer was elevated more than 20, on therapeutic Lovenox.   Chest CTA and lower extremity duplexes with no evidence of thromboembolism.  COVID-19 Labs  Recent Labs    08/23/19 0238 08/24/19 0244 08/25/19 0855  DDIMER 13.29* 11.61* 8.41*  CRP 0.8 0.6 0.6    Hypertension:  Blood pressure stable.  He is on amlodipine and Terazosin.  Resumed.  Hyperlipidemia:  On a statin at home.  Continue.  BPH:  On Flomax that he will continue.   Hypothyroidism:  Resumed home thyroxine.  TSH normal.   Discharge planning:  Mobilize.  PT OT.  Continue to wean off oxygen. Evaluate for supplemental home oxygen need. Patient had some deficit in mobility, he will benefit with PT at home.  He has adequate support at home, wants to go home with home health PT and home health aide. Anticoagulation, no data available of additional benefit with long-term anticoagulation, I discussed with patient, would suggest aspirin 325 mg daily on discharge. Steroids, with significant ARDS, may benefit with prolonged taper for 3 weeks.  DVT prophylaxis: Therapeutic Lovenox. Code Status: Full code Family Communication: Patient's wife, Dr. Evette Cristal. Discussed discharge planning. Disposition Plan: Mobility, remains in the hospital. Anticipate home with home PT and home health aid.  Consultants:   PCCM  Procedures:   None  Antimicrobials:   Remdesivir, 08/17/2019--- 08/21/2019  Subjective: No overnight events.  Patient remains moderately dyspneic with minimal exertion, otherwise declines fevers, chills, nausea, vomiting, chest pain, headache.  Objective: Vitals:   08/24/19 2005 08/25/19 0535 08/25/19 0740 08/25/19 1643  BP:  128/76 (!) 142/79 127/88  Pulse:  60 62 89  Resp:  18 18 16   Temp:  98.9 F (37.2 C) 98.2  F (36.8 C) 99.2 F (37.3 C)  TempSrc:  Oral Oral Oral  SpO2: 90% 93% 92% 93%  Weight:      Height:       No intake or  output data in the 24 hours ending 08/25/19 1711 Filed Weights   08/17/19 0939  Weight: 72.6 kg    Examination:  Physical Exam  Constitutional: He is oriented to person, place, and time.  Not in any distress.  Comfortably resting in bed. On 1-2 liters of oxygen.  HENT:  Head: Normocephalic and atraumatic.  Eyes: Pupils are equal, round, and reactive to light.  Cardiovascular: Normal rate and regular rhythm. Exam reveals no friction rub.  Pulmonary/Chest:  Not in any respiratory distress.  On 2L nasal canula. Occasional fine crackles on expiration.   Abdominal: Bowel sounds are normal. He exhibits no distension.  Musculoskeletal:     Cervical back: Neck supple.  Neurological: He is alert and oriented to person, place, and time.  Psychiatric: He has a normal mood and affect.    Data Reviewed: I have personally reviewed following labs and imaging studies  CBC: Recent Labs  Lab 08/19/19 0158 08/20/19 0110 08/21/19 0000 08/22/19 1220 08/23/19 0238  WBC 7.5 7.8 9.6 11.1* 9.9  NEUTROABS  --   --   --   --  8.0*  HGB 11.1* 11.3* 12.9* 13.0 12.6*  HCT 33.1* 33.8* 38.2* 38.6* 37.9*  MCV 88.7 88.9 88.4 88.9 90.7  PLT 366 353 387 436* [id number]   Basic Metabolic Panel: Recent Labs  Lab 08/21/19 0000 08/21/19 1145 08/22/19 0528 08/23/19 0238 08/24/19 0244 08/25/19 0855  NA 139  --  139 142 139 140  K 3.7  --  4.8 4.1 4.0 3.5  CL 102  --  104 106 106 110  CO2 24  --  23 24 23  20*  GLUCOSE 111*  --  79 101* 90 140*  BUN 29*  --  36* 44* 34* 29*  CREATININE 0.70  --  0.74 0.76 0.64 0.83  CALCIUM 7.8*  --  7.9* 8.0* 7.7* 7.7*  MG 2.3 2.2  --  2.3  --   --   PHOS 4.0 4.0  --  4.1  --   --    GFR: Estimated Creatinine Clearance: 71.5 mL/min (by C-G formula based on SCr of 0.83 mg/dL). Liver Function Tests: Recent Labs  Lab 08/21/19 0000 08/22/19 0528 08/23/19 0238 08/24/19 0244 08/25/19 0855  AST 58* 50* 40 33 35  ALT 85* 65* 71* 69* 60*  ALKPHOS 57 59 50 44 49    BILITOT 1.0 1.6* 0.7 1.1 0.8  PROT 6.2* 5.8* 5.9* 5.7* 5.7*  ALBUMIN 2.7* 2.6* 2.7* 2.7* 2.6*   No results for input(s): LIPASE, AMYLASE in the last 168 hours. No results for input(s): AMMONIA in the last 168 hours. Coagulation Profile: No results for input(s): INR, PROTIME in the last 168 hours. Cardiac Enzymes: No results for input(s): CKTOTAL, CKMB, CKMBINDEX, TROPONINI in the last 168 hours. BNP (last 3 results) No results for input(s): PROBNP in the last 8760 hours. HbA1C: No results for input(s): HGBA1C in the last 72 hours. CBG: Recent Labs  Lab 08/20/19 0709 08/20/19 1145 08/20/19 2041 08/21/19 0722 08/21/19 1150  GLUCAP 105* 137* 126* 93 123*   Lipid Profile: No results for input(s): CHOL, HDL, LDLCALC, TRIG, CHOLHDL, LDLDIRECT in the last 72 hours. Thyroid Function Tests: No results for input(s): TSH, T4TOTAL, FREET4, T3FREE, THYROIDAB in the last 72 hours. Anemia Panel:  No results for input(s): VITAMINB12, FOLATE, FERRITIN, TIBC, IRON, RETICCTPCT in the last 72 hours. Sepsis Labs: Recent Labs  Lab 08/20/19 0110 08/21/19 0000  PROCALCITON <0.10 <0.10    Recent Results (from the past 240 hour(s))  Blood culture (routine x 2)     Status: None   Collection Time: 08/15/19  5:40 PM   Specimen: BLOOD  Result Value Ref Range Status   Specimen Description BLOOD LEFT ANTECUBITAL  Final   Special Requests   Final    BOTTLES DRAWN AEROBIC AND ANAEROBIC Blood Culture results may not be optimal due to an inadequate volume of blood received in culture bottles   Culture   Final    NO GROWTH 5 DAYS Performed at [facility], [place] [address], [place], [place] [address]    Report Status 08/20/2019 FINAL  Final  Blood culture (routine x 2)     Status: None   Collection Time: 08/15/19  5:42 PM   Specimen: BLOOD  Result Value Ref Range Status   Specimen Description BLOOD RIGHT ANTECUBITAL  Final   Special Requests   Final    BOTTLES DRAWN AEROBIC AND ANAEROBIC  Blood Culture results may not be optimal due to an excessive volume of blood received in culture bottles   Culture   Final    NO GROWTH 5 DAYS Performed at [facility], [place] [address], [place], [place] [address]    Report Status 08/20/2019 FINAL  Final  Blood culture (routine x 2)     Status: None   Collection Time: 08/17/19 11:36 AM   Specimen: BLOOD  Result Value Ref Range Status   Specimen Description BLOOD LEFT ANTECUBITAL  Final   Special Requests   Final    BOTTLES DRAWN AEROBIC AND ANAEROBIC Blood Culture adequate volume   Culture   Final    NO GROWTH 5 DAYS Performed at [facility], [place] [address] [place], [place] [address]    Report Status 08/22/2019 FINAL  Final  Blood culture (routine x 2)     Status: None   Collection Time: 08/17/19 12:04 PM   Specimen: BLOOD RIGHT HAND  Result Value Ref Range Status   Specimen Description BLOOD RIGHT HAND  Final   Special Requests   Final    BOTTLES DRAWN AEROBIC ONLY Blood Culture results may not be optimal due to an inadequate volume of blood received in culture bottles   Culture   Final    NO GROWTH 5 DAYS Performed at [facility], [place] [address] [place], [place] [address]    Report Status 08/22/2019 FINAL  Final         Radiology Studies: No results found.      Scheduled Meds: . sodium chloride   Intravenous Once  . albuterol  2 puff Inhalation Q6H  . amLODipine  5 mg Oral Daily  . dexamethasone  6 mg Oral Daily  . enoxaparin (LOVENOX) injection  70 mg Subcutaneous Q12H  . famotidine  20 mg Oral BID  . feeding supplement (ENSURE ENLIVE)  237 mL Oral TID BM  . finasteride  5 mg Oral Daily  . rosuvastatin  10 mg Oral q1800  . senna-docusate  1 tablet Oral BID  . tamsulosin  0.4 mg Oral Daily   Continuous Infusions:    LOS: 8 days    Time spent: 30 minutes    Little Ishikawa, MD [facility] Pager [phone]

## 2019-08-26 DIAGNOSIS — E039 Hypothyroidism, unspecified: Secondary | ICD-10-CM

## 2019-08-26 NOTE — Discharge Summary (Signed)
Physician Discharge Summary  David Villegas [id number] DOB: 02/19/1946 DOA: 08/17/2019  PCP: Patient, No Pcp Per  Admit date: 08/17/2019 Discharge date: 08/26/2019  Admitted From: Home Disposition: Home  Recommendations for Outpatient Follow-up:  1. Follow up with PCP in 1-2 weeks 2. 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  Patient started having symptoms of fever and cough at around Thanksgiving, 11/26, tested positive about 1 week ago.  He started developing more shortness of breath, on 12/6 was seen in the emergency room, no hypoxia on ambulation, sent home with azithromycin.  Came back with worsening shortness of breath and hypoxia.  In the emergency room, his saturations were 60% and he was started 100% nonrebreather.  Chest x-ray showed multifocal pneumonia.  Admited to the hospital with multifocal pneumonia due to COVID-19 with worsening hypoxemia.  Patient admitted as above with acute hypoxic respiratory failure in the setting of COVID-19 infection, patient had markedly elevated D-dimer concerning for blood clot however imaging was negative.  D-dimer now downtrending appropriately with treatment of COVID-19.  Initially required nonrebreather and high flow nasal cannula, now able to ambulate on just 1 to 2 L nasal cannula without overt hypoxia or symptomatology.  Patient has completed Remdesivir course as well as his 10-day course of Decadron.  Given respiratory status is improving without wheeze we will not use steroids at this time.  Given patient's improvement in D-dimer will not anticoagulate per discussion with pharmacy and per current guidelines.  Certainly at his age daily aspirin would not be unreasonable - will need follow-up D-dimer in the next 5 to 7 days to ensure ongoing resolution. If  patient were to have acute worsening shortness of breath, chest pain or dyspnea with fatigue would have patient report immediately back to the emergency department for further work-up and evaluation.  At this time patient appears well, tolerating p.o. well ambulating without overt hypoxia or dyspnea on 2 L nasal cannula.  Will discharge home with home health and oxygen, close follow-up with PCP in the next week.  Discharge Diagnoses:  Active Problems:   Pneumonia due to COVID-19 virus   Essential hypertension   Hypothyroidism   HLD (hyperlipidemia)   BPH (benign prostatic hyperplasia)  Pneumonia due to COVID-19 virus, acute respiratory disease due to COVID-19 with acute hypoxemic respiratory failure: Oxygen requirement continue to improve - still somewhat hypoxic/symptomatic with minimal exertion Continue chest physiotherapy, incentive spirometry, deep breathing exercises, sputum induction, mucolytic's and bronchodilators.   Continue mobility.   Covid directed therapy with, steroids on dexamethasone, change to oral dexamethasone Remdesivir completed 5 days of therapy. Actemra dose on 12/8, repeat dose on 12/9 Convalescent plasma ,12/9, 1 unit received.  Antibiotics not indicated.  Procalcitonin normal. D-dimer was elevated more than 20, on therapeutic Lovenox.   Chest CTA and lower extremity duplexes with no evidence of thromboembolism.  Recent Labs    08/24/19 0244 08/25/19 0855  DDIMER 11.61* 8.41*  CRP 0.6 0.6    Hypertension:  Blood pressure stable.  He is on amlodipine and Terazosin.  Resumed.  Hyperlipidemia:  On a statin at home.  Continue.  BPH:  On Flomax that he will continue.   Hypothyroidism:  Resumed home thyroxine.  TSH normal.    Discharge Instructions  Discharge Instructions    Call MD for:  difficulty breathing, headache or visual disturbances   Complete by: As  directed    Call MD for:  extreme fatigue   Complete by: As directed    Call MD for:   hives   Complete by: As directed    Call MD for:  persistant dizziness or light-headedness   Complete by: As directed    Call MD for:  persistant nausea and vomiting   Complete by: As directed    Call MD for:  severe uncontrolled pain   Complete by: As directed    Call MD for:  temperature >100.4   Complete by: As directed    Diet - low sodium heart healthy   Complete by: As directed    For home use only DME oxygen   Complete by: As directed    Patient Saturations on Room Air at Rest = 90%  Patient Saturations on Room Air while Ambulating = 82%-86%  Patient Saturations on 2 Liters of oxygen while Ambulating = 88%   Patient desats to low 80s on room air. Oxygenation improved to 88-89% on 2L [place]. Sats immediately return to 90% on 2L [place] at rest, and 91% on RA within a few minutes.   Length of Need: 12 Months   Mode or (Route): Nasal cannula   Liters per Minute: 2   Frequency: Continuous (stationary and portable oxygen unit needed)   Oxygen conserving device: Yes   Oxygen delivery system: Gas   Increase activity slowly   Complete by: As directed      Allergies as of 08/26/2019   No Known Allergies     Medication List    STOP taking these medications   doxycycline 100 MG tablet Commonly known as: VIBRA-TABS     TAKE these medications   albuterol 108 (90 Base) MCG/ACT inhaler Commonly known as: VENTOLIN HFA Inhale 2 puffs into the lungs every 6 (six) hours as needed for shortness of breath.   amLODipine 5 MG tablet Commonly known as: NORVASC Take 5 mg by mouth daily. Notes to patient: Next: 12/18 AM   aspirin EC 325 MG tablet Take 1 tablet (325 mg total) by mouth daily. Notes to patient: Next: 12/18 AM   cetirizine 10 MG tablet Commonly known as: ZYRTEC Take 10 mg by mouth daily. Notes to patient: Next: 12/18 AM   dexamethasone 2 MG tablet Commonly known as: DECADRON 3 tabs daily for 3 days 2 tabs daily for 5 days 1 tabs daily for 5 days What changed:    medication strength  how much to take  how to take this  when to take this  additional instructions Notes to patient: Next: 12/18 AM   finasteride 5 MG tablet Commonly known as: PROSCAR Take 5 mg by mouth daily. Notes to patient: Next: 12/18 AM   levothyroxine 150 MCG tablet Commonly known as: SYNTHROID Take 150 mcg by mouth daily. Notes to patient: Next: 12/18 AM   potassium chloride SA 20 MEQ tablet Commonly known as: KLOR-CON Take 1 tablet (20 mEq total) by mouth daily. Notes to patient: Next: 12/18 AM   rosuvastatin 10 MG tablet Commonly known as: CRESTOR Take 10 mg by mouth at bedtime. Notes to patient: Next: 12/17 PM   tamsulosin 0.4 MG Caps capsule Commonly known as: FLOMAX Take 0.4 mg by mouth daily. Notes to patient: Next: 12/18 AM   Vitamin D (Ergocalciferol) 1.25 MG (50000 UT) Caps capsule Commonly known as: DRISDOL Take 50,000 Units by mouth once a week. Monday or Tuesday Notes to patient: Next: 12/21 AM   zolpidem 10 MG  tablet Commonly known as: AMBIEN Take 10 mg by mouth at bedtime as needed. Notes to patient: Next: 12/17 PM            Durable Medical Equipment  (From admission, onward)         Start     Ordered   08/26/19 1144  For home use only DME oxygen  Once    Question Answer Comment  Length of Need 6 Months   Mode or (Route) Nasal cannula   Liters per Minute 2   Frequency Continuous (stationary and portable oxygen unit needed)   Oxygen conserving device No   Oxygen delivery system Gas      08/26/19 1144   08/24/19 0000  For home use only DME oxygen    Comments: Patient Saturations on Room Air at Rest = 90%  Patient Saturations on Room Air while Ambulating = 82%-86%  Patient Saturations on 2 Liters of oxygen while Ambulating = 88%   Patient desats to low 80s on room air. Oxygenation improved to 88-89% on 2L [place]. Sats immediately return to 90% on 2L [place] at rest, and 91% on RA within a few minutes.  Question Answer  Comment  Length of Need 12 Months   Mode or (Route) Nasal cannula   Liters per Minute 2   Frequency Continuous (stationary and portable oxygen unit needed)   Oxygen conserving device Yes   Oxygen delivery system Gas      08/24/19 1649          No Known Allergies  Procedures/Studies: CT ANGIO CHEST PE W OR WO CONTRAST  Result Date: 08/20/2019 CLINICAL DATA:  Hypoxemia. COVID-19 infection. EXAM: CT ANGIOGRAPHY CHEST WITH CONTRAST TECHNIQUE: Multidetector CT imaging of the chest was performed using the standard protocol during bolus administration of intravenous contrast. Multiplanar CT image reconstructions and MIPs were obtained to evaluate the vascular anatomy. CONTRAST:  72mL OMNIPAQUE IOHEXOL 350 MG/ML SOLN COMPARISON:  CT chest 08/15/2019 FINDINGS: Cardiovascular: Scattered atherosclerosis. No signs of aneurysm in the aorta in the chest. Heart size stable without significant pericardial fluid. Signs of coronary artery disease as before. No sign of acute pulmonary embolism. Mediastinum/Nodes: Mild enlargement of subcarinal nodal tissue slightly increased from previous study approximately 1.4 cm in short axis, likely reactive. Thoracic inlet structures are unremarkable. Mild enlargement of bilateral hilar lymph nodes. Lungs/Pleura: Since the previous CT assessment of 08/15/2019 there has been development of small bilateral pleural fluid collections right greater than left. There is diffuse ground-glass in the chest, associated also with septal thickening nearly confluent in the right chest with minimal sparing in the right lung. Left lower lobe and left lung apex with similar involvement but with sparing of the left mid chest. Upper Abdomen: Stable bilateral adrenal thickening. No acute findings in the upper abdomen. Musculoskeletal: No signs of acute bone finding or destructive bone process. Review of the MIP images confirms the above findings. IMPRESSION: 1. Negative for acute pulmonary  embolism. 2. Interval development of small bilateral pleural fluid collections right greater than left. 3. Worsening of areas of ground-glass and septal thickening, now more confluent than on the prior study suspicious for edema and or developing ARDS superimposed on multifocal pneumonia in the setting of COVID-19 infection. 4. Mild enlargement of bilateral hilar lymph nodes, likely reactive. 5. Coronary artery and aortic atherosclerosis. Aortic Atherosclerosis (ICD10-I70.0). Electronically Signed   By: Zetta Bills M.D.   On: 08/20/2019 13:00   CT Angio Chest PE W/Cm &/Or Wo Cm  Result  Date: 08/15/2019 CLINICAL DATA:  Intermittent fever, dry cough for 2 weeks, tested COVID-19 positive this past Wednesday, new onset cough, history hypertension EXAM: CT ANGIOGRAPHY CHEST WITH CONTRAST TECHNIQUE: Multidetector CT imaging of the chest was performed using the standard protocol during bolus administration of intravenous contrast. Multiplanar CT image reconstructions and MIPs were obtained to evaluate the vascular anatomy. CONTRAST:  163mL OMNIPAQUE IOHEXOL 350 MG/ML SOLN IV COMPARISON:  None FINDINGS: Cardiovascular: Atherosclerotic calcifications aorta, coronary arteries and proximal great vessels. Aorta normal caliber. Upper normal size of cardiac chambers. No pericardial effusion. Pulmonary arteries well opacified and patent. No evidence of pulmonary embolism. Mediastinum/Nodes: Base of cervical region normal appearance. No thoracic adenopathy. Esophagus unremarkable. Lungs/Pleura: Extensive BILATERAL patchy ground-glass and airspace infiltrates consistent with multifocal pneumonia and history of COVID-19. No pleural effusion or pneumothorax. No discrete mass/nodule identified. Upper Abdomen: Visualized upper abdomen normal appearance Musculoskeletal: Osseous structures unremarkable. Review of the MIP images confirms the above findings. IMPRESSION: No evidence of pulmonary embolism. Extensive BILATERAL patchy  ground-glass and airspace infiltrates consistent with multifocal pneumonia due to COVID-19. Aortic Atherosclerosis (ICD10-I70.0). Electronically Signed   By: Lavonia Dana M.D.   On: 08/15/2019 21:11   DG CHEST PORT 1 VIEW  Result Date: 08/18/2019 CLINICAL DATA:  Pneumonia. EXAM: PORTABLE CHEST 1 VIEW COMPARISON:  08/17/2019 and CT chest 08/15/2019. FINDINGS: Trachea is midline. Heart size stable. Fairly diffuse bilateral airspace opacification, with slight sparing of the left upper lobe. Probable tiny bilateral pleural effusions. No pneumothorax. Findings are similar to 08/17/2019. IMPRESSION: 1. Fairly diffuse bilateral airspace disease with relative sparing of the left upper lobe, similar to yesterday's exam and in keeping with COVID pneumonia. 2. Probable tiny bilateral pleural effusions. Electronically Signed   By: Lorin Picket M.D.   On: 08/18/2019 10:19   DG Chest Portable 1 View  Result Date: 08/17/2019 CLINICAL DATA:  covid positive, hypoxia, cough, on abx for PNAcovid positive, hypoxia, cough, on abx for PNA EXAM: PORTABLE CHEST 1 VIEW COMPARISON:  08/13/2019 FINDINGS: There are patchy parenchymal infiltrates bilaterally, significantly increased over the prior study and involving the RIGHT lung greater than the LEFT. Heart size is normal. No pulmonary edema. IMPRESSION: Significantly increased bilateral pulmonary infiltrates, RIGHT greater than LEFT. Electronically Signed   By: Nolon Nations M.D.   On: 08/17/2019 11:29   VAS [place] LOWER EXTREMITY VENOUS (DVT)  Result Date: 08/21/2019  Lower Venous Study Indications: Elevated d-dimer. Other Indications: COVID+. Comparison Study: No prior study. Performing Technologist: Maudry Mayhew RDMS, RVT, RDCS  Examination Guidelines: A complete evaluation includes B-mode imaging, spectral Doppler, color Doppler, and power Doppler as needed of all accessible portions of each vessel. Bilateral testing is considered an integral part of a complete  examination. Limited examinations for reoccurring indications may be performed as noted.  +---------+---------------+---------+-----------+----------+--------------+ RIGHT    CompressibilityPhasicitySpontaneityPropertiesThrombus Aging +---------+---------------+---------+-----------+----------+--------------+ CFV      Full           Yes      Yes                                 +---------+---------------+---------+-----------+----------+--------------+ SFJ      Full                                                        +---------+---------------+---------+-----------+----------+--------------+  FV Prox  Full                                                        +---------+---------------+---------+-----------+----------+--------------+ FV Mid   Full                                                        +---------+---------------+---------+-----------+----------+--------------+ FV DistalFull                                                        +---------+---------------+---------+-----------+----------+--------------+ PFV      Full                                                        +---------+---------------+---------+-----------+----------+--------------+ POP      Full           Yes      Yes                                 +---------+---------------+---------+-----------+----------+--------------+ PTV      Full                                                        +---------+---------------+---------+-----------+----------+--------------+ PERO     Full                                                        +---------+---------------+---------+-----------+----------+--------------+   +---------+---------------+---------+-----------+----------+--------------+ LEFT     CompressibilityPhasicitySpontaneityPropertiesThrombus Aging +---------+---------------+---------+-----------+----------+--------------+ CFV      Full           Yes       Yes                                 +---------+---------------+---------+-----------+----------+--------------+ SFJ      Full                                                        +---------+---------------+---------+-----------+----------+--------------+ FV Prox  Full                                                        +---------+---------------+---------+-----------+----------+--------------+  FV Mid   Full                                                        +---------+---------------+---------+-----------+----------+--------------+ FV DistalFull                                                        +---------+---------------+---------+-----------+----------+--------------+ PFV      Full                                                        +---------+---------------+---------+-----------+----------+--------------+ POP      Full           Yes      Yes                                 +---------+---------------+---------+-----------+----------+--------------+ PTV      Full                                                        +---------+---------------+---------+-----------+----------+--------------+ PERO     Full                                                        +---------+---------------+---------+-----------+----------+--------------+     Summary: Right: There is no evidence of deep vein thrombosis in the lower extremity. No cystic structure found in the popliteal fossa. Left: There is no evidence of deep vein thrombosis in the lower extremity. No cystic structure found in the popliteal fossa.  *See table(s) above for measurements and observations. Electronically signed by Servando Snare MD on 08/21/2019 at 10:19:08 AM.    Final     Subjective: Issues or events overnight, dyspnea with exertion essentially resolved, minimally hypoxic with ambulation will discharge with oxygen as above   Discharge Exam: Vitals:   08/26/19 0444  08/26/19 0904  BP: 134/84 (!) 177/66  Pulse: 64 77  Resp: 18 16  Temp: 97.6 F (36.4 C) 98.2 F (36.8 C)  SpO2: 95% 95%   Vitals:   08/25/19 1643 08/25/19 2002 08/26/19 0444 08/26/19 0904  BP: 127/88 123/79 134/84 (!) 177/66  Pulse: 89 84 64 77  Resp: 16 20 18 16   Temp: 99.2 F (37.3 C) 98.3 F (36.8 C) 97.6 F (36.4 C) 98.2 F (36.8 C)  TempSrc: Oral Oral Oral Oral  SpO2: 93% 93% 95% 95%  Weight:      Height:        General:  Pleasantly resting in bed, No acute distress. HEENT:  Normocephalic atraumatic.  Sclerae nonicteric, noninjected.  Extraocular movements intact bilaterally. Neck:  Without mass  or deformity.  Trachea is midline. Lungs:  Clear to auscultate bilaterally without rhonchi, wheeze, or rales. Heart:  Regular rate and rhythm.  Without murmurs, rubs, or gallops. Abdomen:  Soft, nontender, nondistended.  Without guarding or rebound. Extremities: Without cyanosis, clubbing, edema, or obvious deformity. Vascular:  Dorsalis pedis and posterior tibial pulses palpable bilaterally. Skin:  Warm and dry, no erythema, no ulcerations.   The results of significant diagnostics from this hospitalization (including imaging, microbiology, ancillary and laboratory) are listed below for reference.     Microbiology: Recent Results (from the past 240 hour(s))  Blood culture (routine x 2)     Status: None   Collection Time: 08/17/19 11:36 AM   Specimen: BLOOD  Result Value Ref Range Status   Specimen Description BLOOD LEFT ANTECUBITAL  Final   Special Requests   Final    BOTTLES DRAWN AEROBIC AND ANAEROBIC Blood Culture adequate volume   Culture   Final    NO GROWTH 5 DAYS Performed at [facility], [address], [place], [place] [address]    Report Status 08/22/2019 FINAL  Final  Blood culture (routine x 2)     Status: None   Collection Time: 08/17/19 12:04 PM   Specimen: BLOOD RIGHT HAND  Result Value Ref Range Status   Specimen Description BLOOD RIGHT HAND   Final   Special Requests   Final    BOTTLES DRAWN AEROBIC ONLY Blood Culture results may not be optimal due to an inadequate volume of blood received in culture bottles   Culture   Final    NO GROWTH 5 DAYS Performed at [facility], [place] [address] [place], [place] [address]    Report Status 08/22/2019 FINAL  Final     Labs: BNP (last 3 results) Recent Labs    08/17/19 0949  BNP [id number]*   Basic Metabolic Panel: Recent Labs  Lab 08/21/19 0000 08/21/19 1145 08/22/19 0528 08/23/19 0238 08/24/19 0244 08/25/19 0855  NA 139  --  139 142 139 140  K 3.7  --  4.8 4.1 4.0 3.5  CL 102  --  104 106 106 110  CO2 24  --  23 24 23  20*  GLUCOSE 111*  --  79 101* 90 140*  BUN 29*  --  36* 44* 34* 29*  CREATININE 0.70  --  0.74 0.76 0.64 0.83  CALCIUM 7.8*  --  7.9* 8.0* 7.7* 7.7*  MG 2.3 2.2  --  2.3  --   --   PHOS 4.0 4.0  --  4.1  --   --    Liver Function Tests: Recent Labs  Lab 08/21/19 0000 08/22/19 0528 08/23/19 0238 08/24/19 0244 08/25/19 0855  AST 58* 50* 40 33 35  ALT 85* 65* 71* 69* 60*  ALKPHOS 57 59 50 44 49  BILITOT 1.0 1.6* 0.7 1.1 0.8  PROT 6.2* 5.8* 5.9* 5.7* 5.7*  ALBUMIN 2.7* 2.6* 2.7* 2.7* 2.6*   No results for input(s): LIPASE, AMYLASE in the last 168 hours. No results for input(s): AMMONIA in the last 168 hours. CBC: Recent Labs  Lab 08/20/19 0110 08/21/19 0000 08/22/19 1220 08/23/19 0238  WBC 7.8 9.6 11.1* 9.9  NEUTROABS  --   --   --  8.0*  HGB 11.3* 12.9* 13.0 12.6*  HCT 33.8* 38.2* 38.6* 37.9*  MCV 88.9 88.4 88.9 90.7  PLT 353 387 436* 394   Cardiac Enzymes: No results for input(s): CKTOTAL, CKMB, CKMBINDEX, TROPONINI in the  last 168 hours. BNP: Invalid input(s): POCBNP CBG: Recent Labs  Lab 08/20/19 0709 08/20/19 1145 08/20/19 2041 08/21/19 0722 08/21/19 1150  GLUCAP 105* 137* 126* 93 123*   D-Dimer Recent Labs    08/24/19 0244 08/25/19 0855  DDIMER 11.61* 8.41*   Microbiology Recent Results (from the past 240  hour(s))  Blood culture (routine x 2)     Status: None   Collection Time: 08/17/19 11:36 AM   Specimen: BLOOD  Result Value Ref Range Status   Specimen Description BLOOD LEFT ANTECUBITAL  Final   Special Requests   Final    BOTTLES DRAWN AEROBIC AND ANAEROBIC Blood Culture adequate volume   Culture   Final    NO GROWTH 5 DAYS Performed at [facility], [place] [address], [place], [place] [address]    Report Status 08/22/2019 FINAL  Final  Blood culture (routine x 2)     Status: None   Collection Time: 08/17/19 12:04 PM   Specimen: BLOOD RIGHT HAND  Result Value Ref Range Status   Specimen Description BLOOD RIGHT HAND  Final   Special Requests   Final    BOTTLES DRAWN AEROBIC ONLY Blood Culture results may not be optimal due to an inadequate volume of blood received in culture bottles   Culture   Final    NO GROWTH 5 DAYS Performed at [facility], [place] [address] [place], [place] [address]    Report Status 08/22/2019 FINAL  Final     Time coordinating discharge: Over 30 minutes  SIGNED:   Little Ishikawa, DO [facility] 08/26/2019, 11:44 AM Pager   If 7PM-7AM, please contact night-coverage [email or web address] Password [email or web address]

## 2019-08-26 NOTE — Progress Notes (Addendum)
Update: Patient has been accepted by [facility]- all orders placed by MD   [facility] called- CSW received confirmation that o2 was delivered by patients spouse. Requested Home Health be arranged (orders placed 12/15). CSW will notify spouse once [facility] has been arranged.   All orders in chart at this time- [facility] arrangement will not delay discharge. Patient is set for transportation.   Denials for HH: [facility], [facility], [facility], [facility] Pax, LCSW [facility]  [phone]

## 2019-08-26 NOTE — Discharge Planning (Signed)
Patient SPO2 95% on RA at rest/ Ambulated in hall on [facility] and SPO2 was 92% Patient will need order for home O2 for home and discharge.

## 2019-08-26 NOTE — Care Management Important Message (Signed)
Important Message  Patient Details  Name: David Villegas MRN: [id number] Date of Birth: 13-Feb-1946   Medicare Important Message Given:  Yes - Important Message mailed due to current National Emergency  Verbal consent obtained due to current National Emergency  Relationship to patient: Spouse/Significant Other Contact Name: Artem Haberkorn Call Date: 08/26/19  Time: 1255 Phone: [phone] Outcome: Spoke with contact Important Message mailed to: Patient address on file    Delorse Lek 08/26/2019, 12:56 PM

## 2019-08-26 NOTE — TOC Transition Note (Signed)
Transition of Care [facility]) - CM/SW Discharge Note   Patient Details  Name: David Villegas MRN: [id number] Date of Birth: 1946/05/28  Transition of Care [facility]) CM/SW Contact:  Weston Anna, LCSW Phone Number: 08/26/2019, 11:50 AM   Clinical Narrative:     Patient set to discharge home with oxygen being delivery by [facility]. CSW will set up transportation for patient once o2 concentrator has been delivered at home.    Final next level of care: Home/Self Care Barriers to Discharge: No Barriers Identified   Patient Goals and CMS Choice        Discharge Placement                       Discharge Plan and Services                DME Arranged: Oxygen DME Agency: [place] Date DME Agency Contacted: 08/26/19 Time DME Agency Contacted: 1150 Representative spoke with at DME Agency: Magda Paganini [facility] Arranged: NA          Social Determinants of Health ([place]) Interventions     Readmission Risk Interventions No flowsheet data found.

## 2019-08-26 NOTE — Discharge Planning (Signed)
Patient IV x2 removed.  RN assessment and VS revealed stability for DC to home with HH and O2.  Discharge instructions print, given, explained and educated.  Informed to quarentine for additional 2 weeks beyond DC and to carefully monitor s/sx of self and others in the home for difficulty breathing, excessive lethargy, or uncontrolled temps.  Also told to FU with PCP in 2 weeks.  DC contract signed and placed in chart.  O2 delivered to the home and [facility] orders have neen placed to be set up/[facility] contacted to transport.  Waiting on arrival.

## 2019-08-30 DIAGNOSIS — I2699 Other pulmonary embolism without acute cor pulmonale: Secondary | ICD-10-CM | POA: Diagnosis not present

## 2019-08-30 DIAGNOSIS — E785 Hyperlipidemia, unspecified: Secondary | ICD-10-CM | POA: Diagnosis not present

## 2019-08-30 DIAGNOSIS — U071 COVID-19: Secondary | ICD-10-CM | POA: Diagnosis not present

## 2019-08-30 DIAGNOSIS — Z20828 Contact with and (suspected) exposure to other viral communicable diseases: Secondary | ICD-10-CM | POA: Diagnosis not present

## 2019-08-30 DIAGNOSIS — E559 Vitamin D deficiency, unspecified: Secondary | ICD-10-CM | POA: Diagnosis not present

## 2019-08-30 DIAGNOSIS — J1289 Other viral pneumonia: Secondary | ICD-10-CM | POA: Diagnosis not present

## 2019-08-30 DIAGNOSIS — I10 Essential (primary) hypertension: Secondary | ICD-10-CM | POA: Diagnosis not present

## 2019-08-30 DIAGNOSIS — M2550 Pain in unspecified joint: Secondary | ICD-10-CM | POA: Diagnosis not present

## 2019-09-07 DIAGNOSIS — M2550 Pain in unspecified joint: Secondary | ICD-10-CM | POA: Diagnosis not present

## 2019-09-07 DIAGNOSIS — R5381 Other malaise: Secondary | ICD-10-CM | POA: Diagnosis not present

## 2019-09-07 DIAGNOSIS — I2699 Other pulmonary embolism without acute cor pulmonale: Secondary | ICD-10-CM | POA: Diagnosis not present

## 2019-09-10 DIAGNOSIS — U071 COVID-19: Secondary | ICD-10-CM | POA: Diagnosis not present

## 2019-09-22 DIAGNOSIS — R5381 Other malaise: Secondary | ICD-10-CM | POA: Diagnosis not present

## 2019-09-26 DIAGNOSIS — U071 COVID-19: Secondary | ICD-10-CM | POA: Diagnosis not present

## 2019-09-27 ENCOUNTER — Other Ambulatory Visit: Payer: Self-pay

## 2019-09-27 DIAGNOSIS — U071 COVID-19: Secondary | ICD-10-CM | POA: Diagnosis not present

## 2020-10-19 IMAGING — CT CT ANGIO CHEST
1 of 4 series · 12 of 30 positions shown · IV contrast (OMNIPAQUE)
Comparison: CT chest 08/15/2019

CLINICAL DATA: Hypoxemia. 93XH0-Z2 infection.

EXAM:
CT ANGIOGRAPHY CHEST WITH CONTRAST
TECHNIQUE: Multidetector CT imaging of the chest was performed using the
standard protocol during bolus administration of intravenous
contrast. Multiplanar CT image reconstructions and MIPs were
obtained to evaluate the vascular anatomy.
CONTRAST:  75mL OMNIPAQUE IOHEXOL 350 MG/ML SOLN

[Series 4: pe chest · axial · 0.77mm/px · z∈[+878,+1138]mm · 12 of 156 slices shown]
[im 13/156  lung]
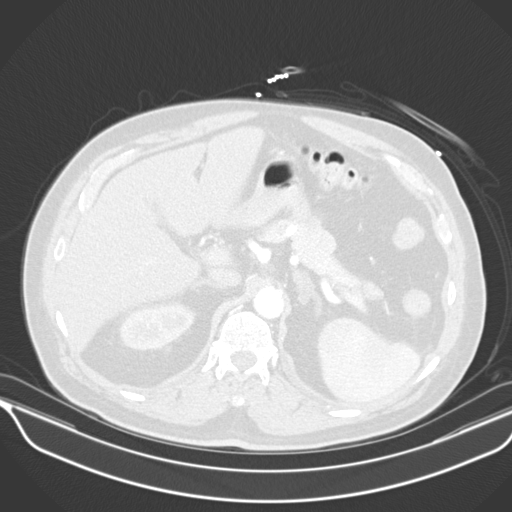
[im 26/156  mediastinal]
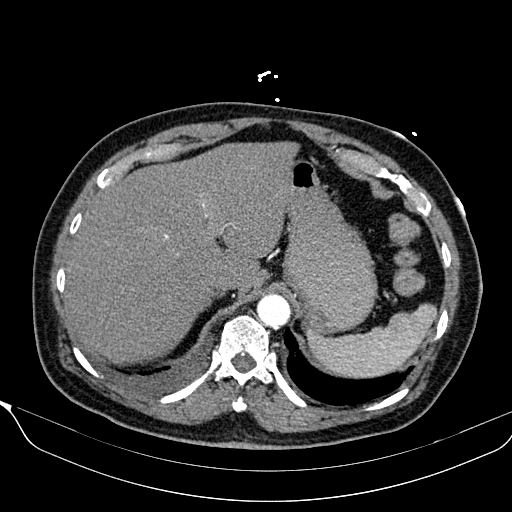
[im 39/156  lung]
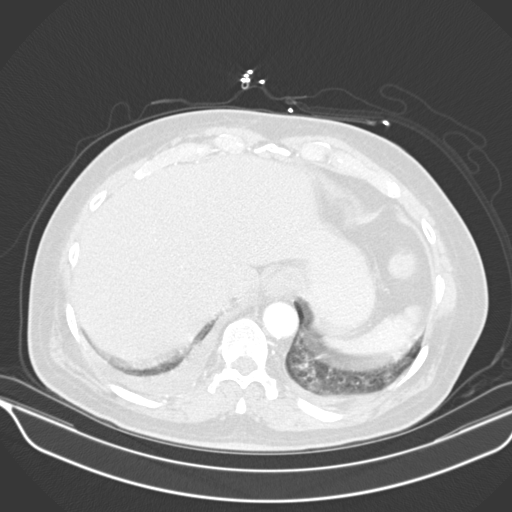
[im 52/156  mediastinal]
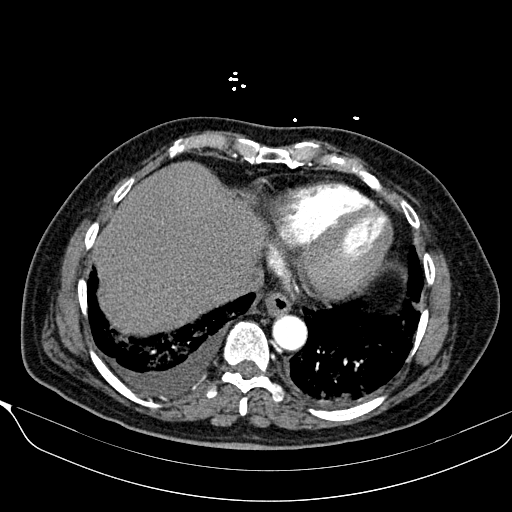
[im 65/156  lung]
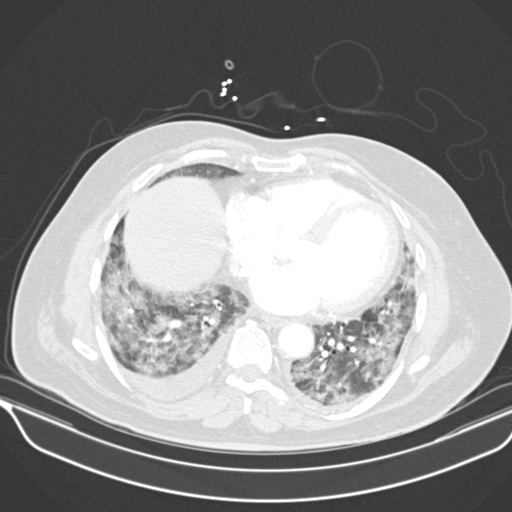
[im 74/156  mediastinal]
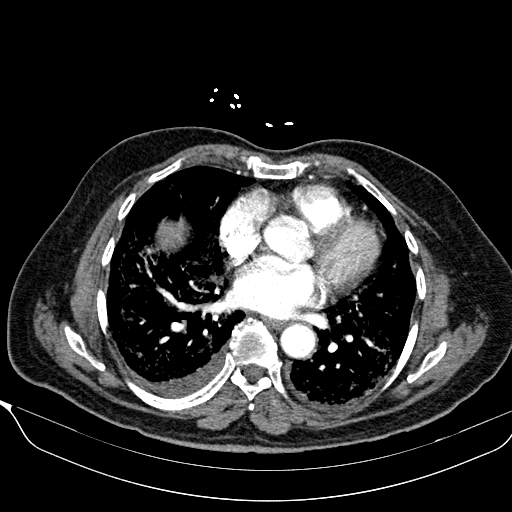
[im 78/156  lung]
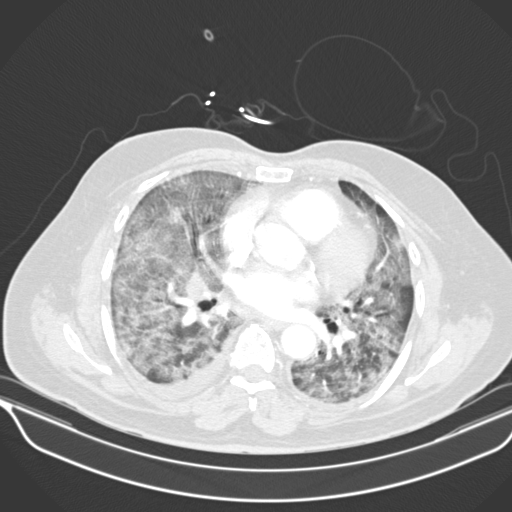
[im 91/156  mediastinal]
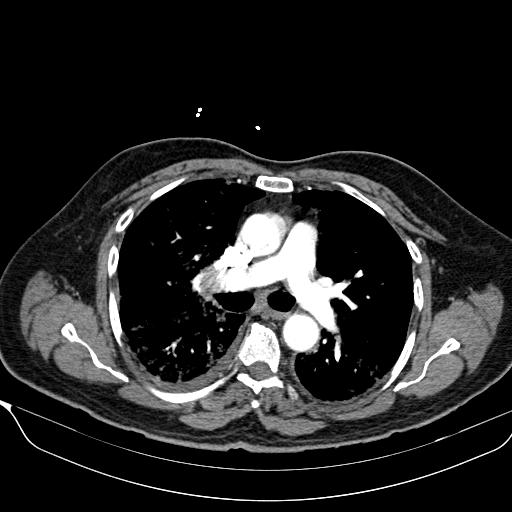
[im 104/156  lung]
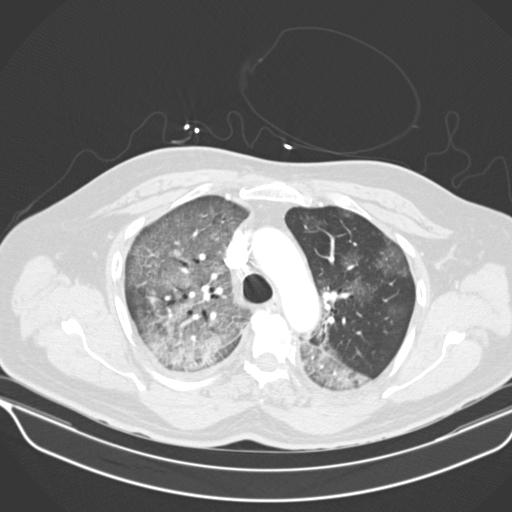
[im 117/156  mediastinal]
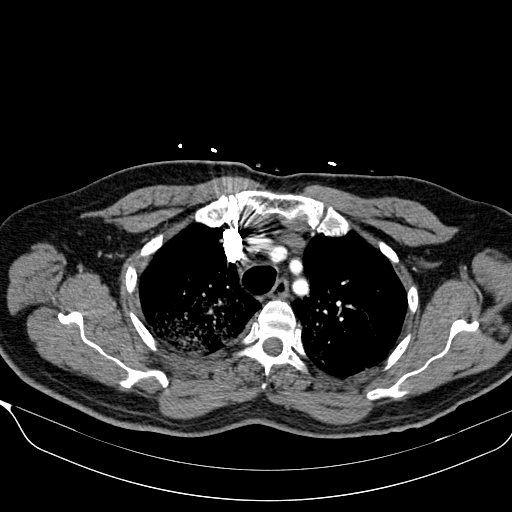
[im 130/156  lung]
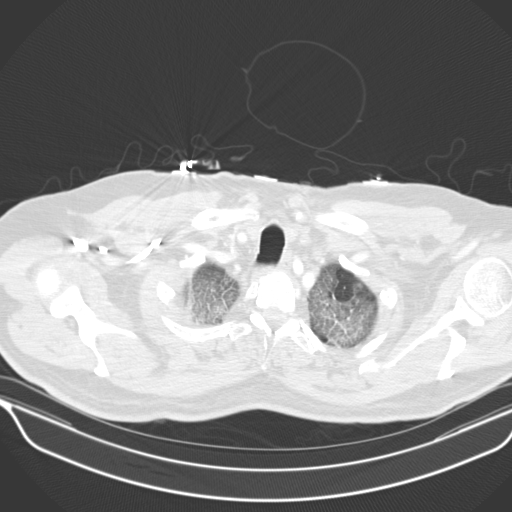
[im 143/156  mediastinal]
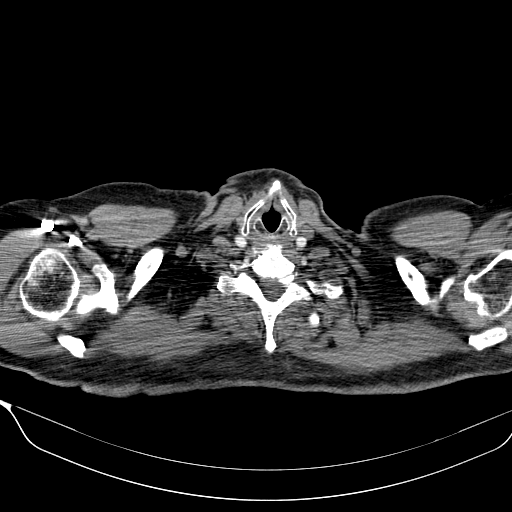

[12 of 30 positions shown; findings below may reference images not displayed]

FINDINGS: Cardiovascular: Scattered atherosclerosis. No signs of aneurysm in
the aorta in the chest. Heart size stable without significant
pericardial fluid. Signs of coronary artery disease as before. No
sign of acute pulmonary embolism.

Mediastinum/Nodes: Mild enlargement of subcarinal nodal tissue
slightly increased from previous study approximately 1.4 cm in short
axis, likely reactive. Thoracic inlet structures are unremarkable.

Mild enlargement of bilateral hilar lymph nodes.

Lungs/Pleura: Since the previous CT assessment of 08/15/2019 there
has been development of small bilateral pleural fluid collections
right greater than left. There is diffuse ground-glass in the chest,
associated also with septal thickening nearly confluent in the right
chest with minimal sparing in the right lung. Left lower lobe and
left lung apex with similar involvement but with sparing of the left
mid chest.

Upper Abdomen: Stable bilateral adrenal thickening. No acute
findings in the upper abdomen.

Musculoskeletal: No signs of acute bone finding or destructive bone
process.

Review of the MIP images confirms the above findings.
IMPRESSION: 1. Negative for acute pulmonary embolism.
2. Interval development of small bilateral pleural fluid collections
right greater than left.
3. Worsening of areas of ground-glass and septal thickening, now
more confluent than on the prior study suspicious for edema and or
developing ARDS superimposed on multifocal pneumonia in the setting
of 93XH0-Z2 infection.
4. Mild enlargement of bilateral hilar lymph nodes, likely reactive.
5. Coronary artery and aortic atherosclerosis.

Aortic Atherosclerosis (C0Q1K-MVL.L).

## 2023-03-25 LAB — LAB REPORT - SCANNED: EGFR: 92

## 2023-03-27 DIAGNOSIS — I493 Ventricular premature depolarization: Secondary | ICD-10-CM | POA: Diagnosis not present

## 2023-03-29 NOTE — Progress Notes (Signed)
Cardiology Office Note:    Date:  03/31/2023   ID:  David Villegas, DOB [birth year], MRN [id number]  PCP:  Patient, No Pcp Per  Cardiologist:  Norman Herrlich, MD   Referring MD: No ref. provider found  ASSESSMENT:    1. PVCs (premature ventricular contractions)   2. Abnormal exercise myocardial perfusion study   3. Essential hypertension   4. Hyperlipidemia, unspecified hyperlipidemia type    PLAN:    In order of problems listed above:  Clinical problem is frequent PVCs new documented on twelve-lead EKG Continue his beta-blocker His treadmill stress test showed normal exercise tolerance heart rate response EKG blood pressure response and PVCs that were present were suppressed at peak exercise and immediately in recovery His perfusion study showed no ischemia but implied cardiomyopathy EF 48% and diffuse hypokinesia perhaps related to his COVID-19 pneumonia and critical hospitalization Further evaluation cardiac CTA scheduled this week Continue lipid-lowering with rosuvastatin His wife inquires whether he would benefit from semaglutide for weight loss  Next appointment 3 weeks   Medication Adjustments/Labs and Tests Ordered: Current medicines are reviewed at length with the patient today.  Concerns regarding medicines are outlined above.  No orders of the defined types were placed in this encounter.  No orders of the defined types were placed in this encounter.     History of Present Illness:    David Villegas is a 77 y.o. male who is being seen today for the evaluation of PVCs at the request of No ref. provider found.  He has a history of hypertension and hyperlipidemia and recently had symptomatic PVCs placed on beta-blocker and referred to [facility] underwent a stress myocardial perfusion study.  The EKG portion was normal with normal exercise tolerance EKG response and occasional PVCs suppressed at peak exercise and immediately post peak exercise.  His office  EKG performed as an outpatient independently reviewed by me showed sinus rhythm with frequent PVCs.  His myocardial perfusion study showed no finding of ischemia and ejection fraction was mildly reduced 48%.  previous chest CTA 2020 during admission for COVID-pneumonia showed coronary artery calcification and aortic atheroma sclerosis  His wife physician is present Further evaluation cardiac CTA scheduled Thursday will work him into our office looking for findings of cardiomyopathy perhaps associated with his severe COVID-pneumonia and hospitalization He notices at times palpitation and will switch him to short acting beta-blocker we can titrate Not having edema shortness of breath orthopnea chest pain or syncope Past Medical History:  Diagnosis Date   Abnormal EKG    Benign essential hypertension    BMI 27.0-27.9,adult    BPH (benign prostatic hyperplasia)    Cervical disc disorder, unspecified, cervicothoracic region    COVID-19    Depression    Dyslipidemia    Essential hypertension 08/17/2019   High cholesterol    HLD (hyperlipidemia) 08/17/2019   Hypertension    Hypogonadism male    Hypothyroidism    Insomnia    Nervousness    Osteoporosis    Other chest pain    Palpitations    Pneumonia due to COVID-19 virus 08/17/2019   Pulmonary embolism (HCC)    PVC (premature ventricular contraction)    Vertigo    Vitamin D deficiency    Xiphodynia     Past Surgical History:  Procedure Laterality Date   MASTOIDECTOMY Left    NASAL SEPTUM SURGERY     TONSILECTOMY, ADENOIDECTOMY, BILATERAL MYRINGOTOMY AND TUBES      Current Medications: Current Meds  Medication Sig   finasteride (PROSCAR) 5 MG tablet Take 5 mg by mouth daily.   hydrOXYzine (ATARAX) 10 MG tablet Take 10 mg by mouth 2 (two) times daily as needed for itching, anxiety, nausea or vomiting.   levothyroxine (SYNTHROID) 175 MCG tablet Take 175 mcg by mouth daily.   metoprolol succinate (TOPROL-XL) 25 MG 24 hr tablet  Take 25 mg by mouth 2 (two) times daily.   nitroGLYCERIN (NITROSTAT) 0.4 MG SL tablet Place 0.4 mg under the tongue every 5 (five) minutes as needed for chest pain.   rosuvastatin (CRESTOR) 10 MG tablet Take 10 mg by mouth at bedtime.   terazosin (HYTRIN) 5 MG capsule Take 5 mg by mouth daily.   Vitamin D, Ergocalciferol, (DRISDOL) 1.25 MG (50000 UT) CAPS capsule Take 50,000 Units by mouth once a week. Monday or Tuesday     Allergies:   Norvasc [amlodipine] and Lisinopril   Social History   Socioeconomic History   Marital status: Unknown    Spouse name: Not on file   Number of children: Not on file   Years of education: Not on file   Highest education level: Not on file  Occupational History   Not on file  Tobacco Use   Smoking status: Never   Smokeless tobacco: Never  Substance and Sexual Activity   Alcohol use: Never   Drug use: Never   Sexual activity: Not on file  Other Topics Concern   Not on file  Social History Narrative   Not on file   Social Determinants of Health   Financial Resource Strain: Not on file  Food Insecurity: Not on file  Transportation Needs: Not on file  Physical Activity: Not on file  Stress: Not on file  Social Connections: Not on file     Family History: The patient's family history includes Heart attack in his father.  ROS:   ROS Please see the history of present illness.     All other systems reviewed and are negative.  EKGs/Labs/Other Studies Reviewed:    The following studies were reviewed today:   Recent Labs 03/24/2023 outpatient troponin normal February total cholesterol 178 sodium 143 potassium 4.3 hemoglobin 13.2 triglycerides 187 LDL 86  Physical Exam:    VS:  BP 138/74   Pulse 62   Ht 5' 5.6" (1.666 m)   Wt 186 lb 3.2 oz (84.5 kg)   SpO2 96%   BMI 30.42 kg/m     Wt Readings from Last 3 Encounters:  03/31/23 186 lb 3.2 oz (84.5 kg)  08/17/19 160 lb (72.6 kg)  08/15/19 165 lb (74.8 kg)     GEN:  Well nourished,  well developed in no acute distress he has no xanthelasma or xanthoma HEENT: Normal NECK: No JVD; No carotid bruits LYMPHATICS: No lymphadenopathy CARDIAC: RRR, no murmurs, rubs, gallops RESPIRATORY:  Clear to auscultation without rales, wheezing or rhonchi  ABDOMEN: Soft, non-tender, non-distended MUSCULOSKELETAL:  No edema; No deformity  SKIN: Warm and dry NEUROLOGIC:  Alert and oriented x 3 PSYCHIATRIC:  Normal affect     Signed, Norman Herrlich, MD  03/31/2023 4:01 PM    [facility]

## 2023-03-31 ENCOUNTER — Other Ambulatory Visit (HOSPITAL_COMMUNITY): Payer: Self-pay | Admitting: Student

## 2023-03-31 ENCOUNTER — Ambulatory Visit: Payer: Medicare HMO | Attending: Cardiology | Admitting: Cardiology

## 2023-03-31 ENCOUNTER — Other Ambulatory Visit: Payer: Self-pay

## 2023-03-31 VITALS — BP 138/74 | HR 62 | Ht 65.6 in | Wt 186.2 lb

## 2023-03-31 DIAGNOSIS — G4700 Insomnia, unspecified: Secondary | ICD-10-CM | POA: Insufficient documentation

## 2023-03-31 DIAGNOSIS — E291 Testicular hypofunction: Secondary | ICD-10-CM | POA: Insufficient documentation

## 2023-03-31 DIAGNOSIS — E785 Hyperlipidemia, unspecified: Secondary | ICD-10-CM

## 2023-03-31 DIAGNOSIS — R002 Palpitations: Secondary | ICD-10-CM | POA: Insufficient documentation

## 2023-03-31 DIAGNOSIS — M5093 Cervical disc disorder, unspecified, cervicothoracic region: Secondary | ICD-10-CM | POA: Insufficient documentation

## 2023-03-31 DIAGNOSIS — R450 Nervousness: Secondary | ICD-10-CM | POA: Insufficient documentation

## 2023-03-31 DIAGNOSIS — I493 Ventricular premature depolarization: Secondary | ICD-10-CM

## 2023-03-31 DIAGNOSIS — R42 Dizziness and giddiness: Secondary | ICD-10-CM | POA: Insufficient documentation

## 2023-03-31 DIAGNOSIS — R9431 Abnormal electrocardiogram [ECG] [EKG]: Secondary | ICD-10-CM | POA: Insufficient documentation

## 2023-03-31 DIAGNOSIS — I10 Essential (primary) hypertension: Secondary | ICD-10-CM | POA: Diagnosis not present

## 2023-03-31 DIAGNOSIS — R0789 Other chest pain: Secondary | ICD-10-CM

## 2023-03-31 DIAGNOSIS — U071 COVID-19: Secondary | ICD-10-CM | POA: Insufficient documentation

## 2023-03-31 DIAGNOSIS — M810 Age-related osteoporosis without current pathological fracture: Secondary | ICD-10-CM | POA: Insufficient documentation

## 2023-03-31 DIAGNOSIS — R9439 Abnormal result of other cardiovascular function study: Secondary | ICD-10-CM

## 2023-03-31 DIAGNOSIS — E559 Vitamin D deficiency, unspecified: Secondary | ICD-10-CM | POA: Insufficient documentation

## 2023-03-31 DIAGNOSIS — F32A Depression, unspecified: Secondary | ICD-10-CM | POA: Insufficient documentation

## 2023-03-31 DIAGNOSIS — I2699 Other pulmonary embolism without acute cor pulmonale: Secondary | ICD-10-CM | POA: Insufficient documentation

## 2023-03-31 DIAGNOSIS — Z6827 Body mass index (BMI) 27.0-27.9, adult: Secondary | ICD-10-CM | POA: Insufficient documentation

## 2023-03-31 DIAGNOSIS — E7800 Pure hypercholesterolemia, unspecified: Secondary | ICD-10-CM | POA: Insufficient documentation

## 2023-03-31 MED ORDER — METOPROLOL TARTRATE 25 MG PO TABS: 25.0000 mg | ORAL_TABLET | Freq: Three times a day (TID) | ORAL | 3 refills | Status: DC

## 2023-03-31 NOTE — Patient Instructions (Signed)
Medication Instructions:  Your physician has recommended you make the following change in your medication:   START: Lopressor 25 mg three times daily STOP: Toprol XL  *If you need a refill on your cardiac medications before your next appointment, please call your pharmacy*   Lab Work: None If you have labs (blood work) drawn today and your tests are completely normal, you will receive your results only by: MyChart Message (if you have MyChart) OR A paper copy in the mail If you have any lab test that is abnormal or we need to change your treatment, we will call you to review the results.   Testing/Procedures: Your physician has requested that you have an echocardiogram. Echocardiography is a painless test that uses sound waves to create images of your heart. It provides your doctor with information about the size and shape of your heart and how well your heart's chambers and valves are working. This procedure takes approximately one hour. There are no restrictions for this procedure. Please do NOT wear cologne, perfume, aftershave, or lotions (deodorant is allowed). Please arrive 15 minutes prior to your appointment time.    Follow-Up: At [facility], you and your health needs are our priority.  As part of our continuing mission to provide you with exceptional heart care, we have created designated Provider Care Teams.  These Care Teams include your primary Cardiologist (physician) and Advanced Practice Providers (APPs -  Physician Assistants and Nurse Practitioners) who all work together to provide you with the care you need, when you need it.  We recommend signing up for the patient portal called "MyChart".  Sign up information is provided on this After Visit Summary.  MyChart is used to connect with patients for Virtual Visits (Telemedicine).  Patients are able to view lab/test results, encounter notes, upcoming appointments, etc.  Non-urgent messages can be sent to your  provider as well.   To learn more about what you can do with MyChart, go to [email or web address].    Your next appointment:   3 week(s)  Provider:   Norman Herrlich, MD    Other Instructions None

## 2023-04-02 ENCOUNTER — Telehealth (HOSPITAL_COMMUNITY): Payer: Self-pay | Admitting: Emergency Medicine

## 2023-04-02 NOTE — Telephone Encounter (Signed)
Reaching out to patient to offer assistance regarding upcoming cardiac imaging study; pt verbalizes understanding of appt date/time, parking situation and where to check in, pre-test NPO status and medications ordered, and verified current allergies; name and call back number provided for further questions should they arise Sara Wallace RN Navigator Cardiac Imaging [facility] [phone] office [phone] cell 

## 2023-04-03 ENCOUNTER — Ambulatory Visit (HOSPITAL_BASED_OUTPATIENT_CLINIC_OR_DEPARTMENT_OTHER)
Admission: RE | Admit: 2023-04-03 | Discharge: 2023-04-03 | Disposition: A | Discharge status: Home / Self Care | Payer: Medicare HMO | Source: Ambulatory Visit | Attending: Cardiology | Admitting: Cardiology

## 2023-04-03 ENCOUNTER — Ambulatory Visit (INDEPENDENT_AMBULATORY_CARE_PROVIDER_SITE_OTHER): Payer: Medicare HMO

## 2023-04-03 ENCOUNTER — Ambulatory Visit (HOSPITAL_COMMUNITY)
Admission: RE | Admit: 2023-04-03 | Discharge: 2023-04-03 | Disposition: A | Discharge status: Home / Self Care | Payer: Medicare HMO | Source: Ambulatory Visit | Attending: Cardiology | Admitting: Cardiology

## 2023-04-03 ENCOUNTER — Other Ambulatory Visit: Payer: Self-pay | Admitting: Cardiology

## 2023-04-03 DIAGNOSIS — I10 Essential (primary) hypertension: Secondary | ICD-10-CM | POA: Diagnosis not present

## 2023-04-03 DIAGNOSIS — I493 Ventricular premature depolarization: Secondary | ICD-10-CM | POA: Diagnosis not present

## 2023-04-03 DIAGNOSIS — R0789 Other chest pain: Secondary | ICD-10-CM | POA: Diagnosis present

## 2023-04-03 DIAGNOSIS — E785 Hyperlipidemia, unspecified: Secondary | ICD-10-CM

## 2023-04-03 DIAGNOSIS — R9439 Abnormal result of other cardiovascular function study: Secondary | ICD-10-CM | POA: Insufficient documentation

## 2023-04-03 DIAGNOSIS — R931 Abnormal findings on diagnostic imaging of heart and coronary circulation: Secondary | ICD-10-CM

## 2023-04-03 DIAGNOSIS — I700 Atherosclerosis of aorta: Secondary | ICD-10-CM | POA: Insufficient documentation

## 2023-04-03 DIAGNOSIS — I2510 Atherosclerotic heart disease of native coronary artery without angina pectoris: Secondary | ICD-10-CM | POA: Diagnosis not present

## 2023-04-03 LAB — ECHOCARDIOGRAM COMPLETE
Area-P 1/2: 3.5 cm2
MV M vel: 3.41 m/s
MV Peak grad: 46.5 mmHg
P 1/2 time: 714 msec
Radius: 0.4 cm
S' Lateral: 3.5 cm

## 2023-04-03 MED ORDER — NITROGLYCERIN 0.4 MG SL SUBL
SUBLINGUAL_TABLET | SUBLINGUAL | Status: AC
  Filled 2023-04-03: qty 2

## 2023-04-03 MED ORDER — NITROGLYCERIN 0.4 MG SL SUBL
0.8000 mg | SUBLINGUAL_TABLET | SUBLINGUAL | Status: DC | PRN
  Administered 2023-04-03: 0.8 mg via SUBLINGUAL

## 2023-04-03 MED ORDER — IOHEXOL 350 MG/ML SOLN
100.0000 mL | Freq: Once | INTRAVENOUS | Status: AC | PRN
  Administered 2023-04-03: 100 mL via INTRAVENOUS

## 2023-04-03 NOTE — Progress Notes (Signed)
Patient tolerated without distress 

## 2023-04-23 ENCOUNTER — Ambulatory Visit: Payer: PPO | Admitting: Cardiology

## 2023-06-05 LAB — LAB REPORT - SCANNED: EGFR: 91

## 2023-06-11 NOTE — Progress Notes (Unsigned)
Cardiology Office Note:    Date:  06/12/2023   ID:  David Villegas, DOB [birth year], MRN [id number]  PCP:  David Larsson, MD  Cardiologist:  David Herrlich, MD    Referring MD: David Larsson, MD    ASSESSMENT:    1. Coronary artery disease of native artery of native heart with stable angina pectoris (HCC)   2. Agatston coronary artery calcium score greater than 400   3. PVCs (premature ventricular contractions)   4. Interstitial lung disease (HCC)   5. Essential hypertension   6. Mixed hyperlipidemia    PLAN:    In order of problems listed above:  Stable I reinforced to him the need to take his aspirin daily 81 mg continue statin from his description his LDL is at target ideal along with his current beta-blocker Further evaluation with an event monitor quantitated ventricular arrhythmia and a cardiac MR to look for substrate Blood pressure is well-controlled on current treatment   Next appointment: I will see him back in 3 months   Medication Adjustments/Labs and Tests Ordered: Current medicines are reviewed at length with the patient today.  Concerns regarding medicines are outlined above.  Orders Placed This Encounter  Procedures   EKG 12-Lead   No orders of the defined types were placed in this encounter.    History of Present Illness:    David Villegas is a 77 y.o. male with a hx of PVCs coronary artery calcification aortic atherosclerosis severe COVID-19 infection with hospitalization and pulmonary fibrosis hypertension and hyper lipidemia last seen 03/31/2023.  An echocardiogram performed 04/03/2023 showing mild concentric LVH EF 60 to 65% normal right ventricular size function and pulmonary artery pressure and mild mitral regurgitation aortic valve is mildly calcified with mild aortic regurgitation.  He had a cardiac CTA performed 04/03/2023 showing peripheral groundglass and interstitial changes in both lungs following his COVID-19 infection his  coronary calcium score was very elevated 1000 5976 percentile plaque volume 58th percentile moderate stenosis in all 3 coronary vessels.  Subsequent FFR was normal in all coronary vessels.  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  He questions why he is having PVCs I told him those to etiologies including his CAD but is not severe flow-limiting and I suspect is more related to his severe COVID-19 infection that he had requiring hospitalization with residual pulmonary interstitial lung disease After our discussion we will place an event monitor to quantify his ventricular arrhythmia and do cardiac MR to assess for scar and late gadolinium enhancement Past Medical History:  Diagnosis Date   Abnormal EKG    Benign essential hypertension    BMI 27.0-27.9,adult    BPH (benign prostatic hyperplasia)    Cervical disc disorder, unspecified, cervicothoracic region    COVID-19    Depression    Dyslipidemia    Essential hypertension 08/17/2019   High cholesterol    HLD (hyperlipidemia) 08/17/2019   Hypertension    Hypogonadism male    Hypothyroidism    Insomnia    Nervousness    Osteoporosis    Other chest  pain    Palpitations    Pneumonia due to COVID-19 virus 08/17/2019   Pulmonary embolism (HCC)    PVC (premature ventricular contraction)    Vertigo    Vitamin D deficiency    Xiphodynia     Current Medications: Current Meds  Medication Sig   finasteride (PROSCAR) 5 MG tablet Take 5 mg by mouth daily.   hydrOXYzine (ATARAX) 10 MG tablet Take 10 mg by mouth 2 (two) times  daily as needed for itching, anxiety, nausea or vomiting.   levothyroxine (SYNTHROID) 175 MCG tablet Take 175 mcg by mouth daily.   metoprolol tartrate (LOPRESSOR) 25 MG tablet Take 1 tablet (25 mg total) by mouth in the morning, at noon, and at bedtime.   nitroGLYCERIN (NITROSTAT) 0.4 MG SL tablet Place 0.4 mg under the tongue every 5 (five) minutes as needed for chest pain.   rosuvastatin (CRESTOR) 10 MG tablet Take 10 mg by mouth at bedtime.   terazosin (HYTRIN) 5 MG capsule Take 5 mg by mouth daily.   Vitamin D, Ergocalciferol, (DRISDOL) 1.25 MG (50000 UT) CAPS capsule Take 50,000 Units by mouth once a week. Monday or Tuesday      EKGs/Labs/Other Studies Reviewed:    The following studies were reviewed today:  Cardiac Studies & Procedures       ECHOCARDIOGRAM  ECHOCARDIOGRAM COMPLETE 04/03/2023  Narrative ECHOCARDIOGRAM REPORT    Patient Name:   David Villegas Date of Exam: 04/03/2023 Medical Rec #:  [id number]        Height:       65.6 in Accession #:    [id number]       Weight:       186.2 lb Date of Birth:  [birth year]        BSA:          1.932 m Patient Age:    77 years         BP:           116/71 mmHg Patient Gender: M                HR:           50 bpm. Exam Location:  [facility]  Procedure: 2D Echo, Cardiac Doppler, Color Doppler and Strain Analysis  Indications:    PVCs (premature ventricular contractions) [I49.3 (ICD-10-CM)]; Abnormal exercise myocardial perfusion study [R94.39 (ICD-10-CM)]; Essential hypertension [I10 (ICD-10-CM)]; Hyperlipidemia, unspecified hyperlipidemia type [E78.5 (ICD-10-CM)]  History:        Patient has no prior history of Echocardiogram examinations. Arrythmias:PVC; Risk Factors:Hypertension and Dyslipidemia.  Sonographer:    Margreta Journey RDCS Referring Phys: [id number] David Villegas David Villegas  IMPRESSIONS   1. Left ventricular ejection fraction, by estimation, is 60 to 65%. The left ventricle has normal function. The left ventricle has  no regional wall motion abnormalities. There is mild left ventricular hypertrophy. Left ventricular diastolic parameters are consistent with Grade II diastolic dysfunction (pseudonormalization). The average left ventricular global longitudinal strain is -13.9 %. The global longitudinal strain is abnormal. 2. Right ventricular systolic function is normal. The right ventricular size is normal. There is normal pulmonary artery systolic pressure. 3. The mitral valve is normal in structure. Mild mitral valve regurgitation. No evidence of mitral stenosis. 4. The aortic valve is calcified. There is mild calcification of the aortic valve. There is mild thickening of the aortic valve. Aortic valve regurgitation is mild. Aortic valve sclerosis/calcification is present, without any evidence of aortic stenosis. 5. The inferior vena cava is  normal in size with greater than 50% respiratory variability, suggesting right atrial pressure of 3 mmHg.  FINDINGS Left Ventricle: Left ventricular ejection fraction, by estimation, is 60 to 65%. The left ventricle has normal function. The left ventricle has no regional wall motion abnormalities. The average left ventricular global longitudinal strain is -13.9 %. The global longitudinal strain is abnormal. The left ventricular internal cavity size was normal in size. There is mild left ventricular hypertrophy. Left ventricular diastolic parameters are consistent with Grade II diastolic dysfunction (pseudonormalization).  Right Ventricle: The right ventricular size is normal. No increase in right ventricular wall thickness. Right ventricular systolic function is normal. There is normal pulmonary artery systolic pressure. The tricuspid regurgitant velocity is 1.68 m/s, and with an assumed right atrial pressure of 3 mmHg, the estimated right ventricular systolic pressure is 14.3 mmHg.  Left Atrium: Left atrial size was normal in size.  Right Atrium: Right atrial size was  normal in size.  Pericardium: There is no evidence of pericardial effusion.  Mitral Valve: The mitral valve is normal in structure. Mild mitral valve regurgitation. No evidence of mitral valve stenosis.  Tricuspid Valve: The tricuspid valve is normal in structure. Tricuspid valve regurgitation is not demonstrated. No evidence of tricuspid stenosis.  Aortic Valve: The aortic valve is calcified. There is mild calcification of the aortic valve. There is mild thickening of the aortic valve. Aortic valve regurgitation is mild. Aortic regurgitation PHT measures 714 msec. Aortic valve sclerosis/calcification is present, without any evidence of aortic stenosis.  Pulmonic Valve: The pulmonic valve was normal in structure. Pulmonic valve regurgitation is not visualized. No evidence of pulmonic stenosis.  Aorta: The aortic root is normal in size and structure.  Venous: The inferior vena cava is normal in size with greater than 50% respiratory variability, suggesting right atrial pressure of 3 mmHg.  IAS/Shunts: No atrial level shunt detected by color flow Doppler.   LEFT VENTRICLE PLAX 2D LVIDd:         4.75 cm   Diastology LVIDs:         3.50 cm   LV e' medial:    5.33 cm/s LV PW:         1.30 cm   LV E/e' medial:  15.2 LV IVS:        1.20 cm   LV e' lateral:   5.98 cm/s LVOT diam:     2.00 cm   LV E/e' lateral: 13.5 LV SV:         67 LV SV Index:   35        2D Longitudinal Strain LVOT Area:     3.14 cm  2D Strain GLS Avg:     -13.9 %   RIGHT VENTRICLE             IVC RV Basal diam:  3.10 cm     IVC diam: 2.00 cm RV Mid diam:    2.40 cm RV S prime:     12.60 cm/s TAPSE (M-mode): 2.3 cm  LEFT ATRIUM             Index        RIGHT ATRIUM           Index LA diam:        4.70 cm 2.43 cm/m   RA Area:     13.20 cm LA Vol (A2C):   55.0 ml 28.47 ml/m  RA Volume:   25.30 ml  13.10 ml/m LA Vol (A4C):  41.9 ml 21.69 ml/m LA Biplane Vol: 49.4 ml 25.58 ml/m AORTIC VALVE              PULMONIC VALVE LVOT Vmax:   77.40 cm/s  PR End Diast Vel: 2.46 msec LVOT Vmean:  49.867 cm/s LVOT VTI:    0.213 m AI PHT:      714 msec  AORTA Ao Root diam: 3.20 cm Ao Asc diam:  3.50 cm Ao Desc diam: 1.90 cm  MITRAL VALVE                  TRICUSPID VALVE MV Area (PHT): 3.50 cm       TR Peak grad:   11.3 mmHg MV Decel Time: 217 msec       TR Vmax:        168.00 cm/s MR Peak grad:    46.5 mmHg MR Vmax:         341.00 cm/s  SHUNTS MR PISA:         1.01 cm     Systemic VTI:  0.21 m MR PISA Eff ROA: 11 mm       Systemic Diam: 2.00 cm MR PISA Radius:  0.40 cm MV E velocity: 80.95 cm/s MV A velocity: 93.55 cm/s MV E/A ratio:  0.87  Gypsy Balsam MD Electronically signed by Gypsy Balsam MD Signature Date/Time: 04/03/2023/2:47:36 PM    Final     CT SCANS  CT CORONARY MORPH W/CTA COR W/SCORE 04/03/2023  Addendum 04/03/2023  3:31 PM ADDENDUM REPORT: 04/03/2023 15:28  EXAM: OVER-READ INTERPRETATION  CT CHEST  The following report is an over-read performed by radiologist Dr. Kela Millin [facility] on 04/03/2023. This over-read does not include interpretation of cardiac or coronary anatomy or pathology. The coronary CTA interpretation by the cardiologist is attached.  COMPARISON:  CT angio chest 08/20/2019  FINDINGS: No significant mediastinal, hilar or axillary adenopathy is present. Peripheral ground-glass and interstitial changes are noted in both lungs. No nodule or mass lesion is present. The airways are patent. No significant pleural or pericardial effusion is present.  Vertebral body heights and alignment are normal. Visualized ribs are within normal limits. The visualized sternum is unremarkable.  Visualized upper abdomen is within normal limits.  IMPRESSION: 1. Peripheral ground-glass and interstitial changes in both lungs compatible with interstitial lung disease. 2. No other significant extracardiac  findings.   Electronically Signed By: Marin Roberts M.D. On: 04/03/2023 15:28  Narrative EXAM: Cardiac/Coronary CT  TECHNIQUE: The patient was scanned on a [employer].  PROTOCOL: A 120 kV prospective scan was triggered in the descending thoracic aorta at 111 HU's. Axial non-contrast 3 mm slices were carried out through the heart. The data set was analyzed on a dedicated work station and scored using the Agatston method. Gantry rotation speed was 250 msecs and collimation was 0.6 mm. Heart rate was optimized medically and sl NTG was given. The 3D data set was reconstructed in 5% intervals of the 35-75 % of the R-R cycle. Systolic and diastolic phases were analyzed on a dedicated work station using MPR, MIP and VRT modes. The patient received OMNIPAQUE IOHEXOL 350 MG/ML SOLN of contrast.  FINDINGS: Coronary calcium score: The patient's coronary artery calcium score is 1059, which places the patient in the 76th percentile.  Coronary arteries: Normal coronary origins.  Right dominance.  Right Coronary Artery: Normal caliber vessel, gives rise to PDA. Diffuse mixed calcified and noncalcified plaque throughout proximal and mid vessel. Maximum 25-49%  stenosis in proximal vessel and 50-69% stenosis in mid vessel.  Left Main Coronary Artery: Normal caliber vessel, short course. Scattered mixed calcified and noncalcified plaque with 1-24% stenosis.  Left Anterior Descending Coronary Artery: Normal caliber vessel. Diffuse mixed calcified and noncalcified plaque throughout proximal and mid vessel. Maximum 25-49% stenosis in proximal vessel. Gives rise to three small diagonal branches with scattered mixed plaque.  Left Circumflex Artery: Normal caliber vessel. Diffuse mixed calcified and noncalcified plaque throughout proximal and mid vessel. Maximum 25-49% stenosis in mid vessel. Gives rise to medium first and large second OM branches with scattered mixed  plaque.  Aorta: Normal size, 33 mm at the mid ascending aorta (level of the PA bifurcation) measured double oblique. Aortic atherosclerosis. No dissection seen in visualized portions of the aorta.  Aortic Valve: Mild calcifications. Trileaflet.  Other findings:  Normal pulmonary vein drainage into the left atrium.  Normal left atrial appendage without a thrombus.  Normal size of the pulmonary artery.  Normal appearance of the pericardium.  Small PFO with left to right shunt.  IMPRESSION: 1. Moderate stenosis, CADRADS = 3. CT FFR will be performed and reported separately. There is diffuse plaque throughout all three vessel beds with scattered mild stenosis, and there is a 50-69% stenosis in the mid RCA.  2. Coronary calcium score of 1059. This was 76th percentile for age-, sex-, and race- matched controls.  3. Total plaque volume 832 mm3 which is 58th percentile for age- and sex- matched controls (calcified plaque 175 mm3; noncalcified plaque 657 mm3). Total plaque volume is extensive.  4. Normal coronary origin with right dominance.  5.  Aortic atherosclerosis.  6.  Small PFO with left to right shunt.  INTERPRETATION:  CAD-RADS 3: Moderate stenosis (50-69%). Consider symptom-guided anti-ischemic pharmacotherapy as well as risk factor modification per guideline directed care. Additional analysis with CT FFR will be submitted.  Electronically Signed: By: Jodelle Red M.D. On: 04/03/2023 15:10         EKG Interpretation Date/Time:  Thursday June 12 2023 09:07:00 EDT Ventricular Rate:  67 PR Interval:  180 QRS Duration:  78 QT Interval:  402 QTC Calculation: 424 R Axis:   -60  Text Interpretation: Normal sinus rhythm Left axis deviation Poor R wave progression When compared with ECG of 21-Aug-2019 11:52, Premature ventricular complexes are no longer Present Confirmed by David Villegas ([id number]) on 06/12/2023 9:26:46 AM    Physical Exam:    VS:  BP  130/82 (BP Location: Right Arm, Patient Position: Sitting, Cuff Size: Normal)   Pulse 67   Ht 5\' 5"  (1.651 m)   Wt 179 lb 12.8 oz (81.6 kg)   SpO2 96%   BMI 29.92 kg/m     Wt Readings from Last 3 Encounters:  06/12/23 179 lb 12.8 oz (81.6 kg)  03/31/23 186 lb 3.2 oz (84.5 kg)  08/17/19 160 lb (72.6 kg)     GEN:  Well nourished, well developed in no acute distress HEENT: Normal NECK: No JVD; No carotid bruits LYMPHATICS: No lymphadenopathy CARDIAC: RRR, no murmurs, rubs, gallops RESPIRATORY:  Clear to auscultation without rales, wheezing or rhonchi  ABDOMEN: Soft, non-tender, non-distended MUSCULOSKELETAL:  No edema; No deformity  SKIN: Warm and dry NEUROLOGIC:  Alert and oriented x 3 PSYCHIATRIC:  Normal affect    Signed, David Herrlich, MD  06/12/2023 9:23 AM    [facility]

## 2023-06-12 ENCOUNTER — Ambulatory Visit: Payer: Medicare HMO | Attending: Cardiology | Admitting: Cardiology

## 2023-06-12 ENCOUNTER — Encounter: Payer: Self-pay | Admitting: Cardiology

## 2023-06-12 ENCOUNTER — Ambulatory Visit: Payer: Medicare HMO | Attending: Cardiology

## 2023-06-12 VITALS — BP 130/82 | HR 67 | Ht 65.0 in | Wt 179.8 lb

## 2023-06-12 DIAGNOSIS — R931 Abnormal findings on diagnostic imaging of heart and coronary circulation: Secondary | ICD-10-CM | POA: Diagnosis not present

## 2023-06-12 DIAGNOSIS — I10 Essential (primary) hypertension: Secondary | ICD-10-CM

## 2023-06-12 DIAGNOSIS — J849 Interstitial pulmonary disease, unspecified: Secondary | ICD-10-CM

## 2023-06-12 DIAGNOSIS — I25118 Atherosclerotic heart disease of native coronary artery with other forms of angina pectoris: Secondary | ICD-10-CM | POA: Diagnosis not present

## 2023-06-12 DIAGNOSIS — I493 Ventricular premature depolarization: Secondary | ICD-10-CM | POA: Diagnosis not present

## 2023-06-12 DIAGNOSIS — E782 Mixed hyperlipidemia: Secondary | ICD-10-CM

## 2023-06-12 NOTE — Patient Instructions (Signed)
Medication Instructions:  Your physician recommends that you continue on your current medications as directed. Please refer to the Current Medication list given to you today.  *If you need a refill on your cardiac medications before your next appointment, please call your pharmacy*   Lab Work: Your physician recommends that you return for lab work in:   Labs today: H & H  If you have labs (blood work) drawn today and your tests are completely normal, you will receive your results only by: MyChart Message (if you have MyChart) OR A paper copy in the mail If you have any lab test that is abnormal or we need to change your treatment, we will call you to review the results.   Testing/Procedures: A zio monitor was ordered today. It will remain on for 7 days. You will then return monitor and event diary in provided box. It takes 1-2 weeks for report to be downloaded and returned to [place]. We will call you with the results. If monitor falls off or has orange flashing light, please call Z[employer] for further instructions.   Your physician has requested that you have a cardiac MRI. Cardiac MRI uses a computer to create images of your heart as its beating, producing both still and moving pictures of your heart and major blood vessels. For further information please visit [email or web address]. Please follow the instruction sheet given to you today for more information.     Follow-Up: At [facility] you and your health needs are our priority.  As part of our continuing mission to provide you with exceptional heart care, we have created designated Provider Care Teams.  These Care Teams include your primary Cardiologist (physician) and Advanced Practice Providers (APPs -  Physician Assistants and Nurse Practitioners) who all work together to provide you with the care you need, when you need it.  We recommend signing up for the patient portal called "MyChart".  Sign up information is provided on this  After Visit Summary.  MyChart is used to connect with patients for Virtual Visits (Telemedicine).  Patients are able to view lab/test results, encounter notes, upcoming appointments, etc.  Non-urgent messages can be sent to your provider as well.   To learn more about what you can do with MyChart, go to [email or web address].    Your next appointment:   3 month(s)  Provider:   Norman Herrlich, MD    Other Instructions None

## 2023-07-06 DIAGNOSIS — I493 Ventricular premature depolarization: Secondary | ICD-10-CM | POA: Diagnosis not present

## 2023-07-09 ENCOUNTER — Encounter: Payer: Self-pay | Admitting: Cardiology

## 2023-08-13 ENCOUNTER — Encounter (HOSPITAL_COMMUNITY): Payer: Self-pay

## 2023-08-14 ENCOUNTER — Telehealth (HOSPITAL_COMMUNITY): Payer: Self-pay | Admitting: *Deleted

## 2023-08-14 NOTE — Telephone Encounter (Signed)

## 2023-08-15 ENCOUNTER — Other Ambulatory Visit: Payer: Self-pay | Admitting: Cardiology

## 2023-08-15 ENCOUNTER — Ambulatory Visit (HOSPITAL_COMMUNITY)
Admission: RE | Admit: 2023-08-15 | Discharge: 2023-08-15 | Disposition: A | Discharge status: Home / Self Care | Payer: Medicare HMO | Source: Ambulatory Visit | Attending: Cardiology | Admitting: Cardiology

## 2023-08-15 DIAGNOSIS — J849 Interstitial pulmonary disease, unspecified: Secondary | ICD-10-CM

## 2023-08-15 DIAGNOSIS — I25118 Atherosclerotic heart disease of native coronary artery with other forms of angina pectoris: Secondary | ICD-10-CM | POA: Diagnosis present

## 2023-08-15 DIAGNOSIS — E782 Mixed hyperlipidemia: Secondary | ICD-10-CM | POA: Diagnosis present

## 2023-08-15 DIAGNOSIS — R931 Abnormal findings on diagnostic imaging of heart and coronary circulation: Secondary | ICD-10-CM

## 2023-08-15 DIAGNOSIS — I10 Essential (primary) hypertension: Secondary | ICD-10-CM | POA: Diagnosis present

## 2023-08-15 DIAGNOSIS — I493 Ventricular premature depolarization: Secondary | ICD-10-CM

## 2023-08-15 MED ORDER — GADOBUTROL 1 MMOL/ML IV SOLN
10.0000 mL | Freq: Once | INTRAVENOUS | Status: AC | PRN
  Administered 2023-08-15: 10 mL via INTRAVENOUS

## 2023-08-18 ENCOUNTER — Other Ambulatory Visit: Payer: Self-pay | Admitting: Cardiology

## 2023-08-18 DIAGNOSIS — I493 Ventricular premature depolarization: Secondary | ICD-10-CM

## 2023-08-18 DIAGNOSIS — I25118 Atherosclerotic heart disease of native coronary artery with other forms of angina pectoris: Secondary | ICD-10-CM

## 2023-08-18 MED ORDER — METOPROLOL SUCCINATE ER 25 MG PO TB24: 25.0000 mg | ORAL_TABLET | Freq: Every day | ORAL | 3 refills | Status: DC

## 2023-09-11 NOTE — Progress Notes (Signed)
 Cardiology Office Note:    Date:  09/12/2023   ID:  David Villegas, DOB March 01, 1946, MRN [id number]  PCP:  David Caron DASEN, MD  Cardiologist:  David Leiter, MD    Referring MD: David Caron DASEN, MD    ASSESSMENT:    1. PVCs (premature ventricular contractions)   2. Agatston coronary artery calcium  score greater than 400   3. Coronary artery disease of native artery of native heart with stable angina pectoris (HCC)   4. Essential hypertension   5. Mixed hyperlipidemia    PLAN:    In order of problems listed above:  Doing well continue beta-blocker will use long-acting Toprol -XL Lipids recently were on target continue his high intensity statin Stable CAD not having angina on good medical therapy continue aspirin  beta-blocker statin Hypertension well-controlled with his beta-blocker   Next appointment: 6 months   Medication Adjustments/Labs and Tests Ordered: Current medicines are reviewed at length with the patient today.  Concerns regarding medicines are outlined above.  No orders of the defined types were placed in this encounter.  No orders of the defined types were placed in this encounter.    History of Present Illness:    David Villegas is a 78 y.o. male with a hx of CAD elevated coronary artery calcium  score 1000 5976 percentile and moderate CAD 50 to 69% right coronary artery with normal FFR frequent PVCs hypertension and hyperlipidemia last seen 06/12/2023.  Following his last visit cardiac MR was performed showing mild hypertrophy of the septum there is no late enhancement present normal right ventricular size and function mild aortic and mitral regurgitation and low normal left ventricular ejection fraction 54%.  An event monitor reported 07/06/2023 with occasional PVCs burden 3.8%.  Compliance with diet, lifestyle and medications: Yes  He feels well he is exercising careful with diet he has had weight loss is restricting caffeine He is not having  symptomatic palpitation but is still aware of PVCs from his smart watch and checking pulse He takes baby aspirin  81 mg daily will put on his list of medications He tolerates his statin without muscle pain or weakness He has had no angina edema shortness of breath palpitation or syncope We decided to place him on long-acting Toprol  100 mg daily for his PVCs Past Medical History:  Diagnosis Date   Abnormal EKG    Benign essential hypertension    BMI 27.0-27.9,adult    BPH (benign prostatic hyperplasia)    Cervical disc disorder, unspecified, cervicothoracic region    COVID-19    Depression    Dyslipidemia    Essential hypertension 08/17/2019   High cholesterol    HLD (hyperlipidemia) 08/17/2019   Hypertension    Hypogonadism male    Hypothyroidism    Insomnia    Nervousness    Osteoporosis    Other chest pain    Palpitations    Pneumonia due to COVID-19 virus 08/17/2019   Pulmonary embolism (HCC)    PVC (premature ventricular contraction)    Vertigo    Vitamin D deficiency    Xiphodynia     Current Medications: Current Meds  Medication Sig   amLODipine  (NORVASC ) 2.5 MG tablet Take 2.5 mg by mouth daily.   finasteride  (PROSCAR ) 5 MG tablet Take 5 mg by mouth daily.   griseofulvin (GRIS-PEG) 250 MG tablet Take 250 mg by mouth daily.   levothyroxine  (SYNTHROID ) 175 MCG tablet Take 175 mcg by mouth daily.   metoprolol  succinate (TOPROL  XL) 25 MG 24 hr  tablet Take 1 tablet (25 mg total) by mouth daily.   metoprolol  tartrate (LOPRESSOR ) 25 MG tablet Take 1 tablet (25 mg total) by mouth in the morning, at noon, and at bedtime.   nitroGLYCERIN  (NITROSTAT ) 0.4 MG SL tablet Place 0.4 mg under the tongue every 5 (five) minutes as needed for chest pain.   rosuvastatin  (CRESTOR ) 10 MG tablet Take 10 mg by mouth at bedtime.   terazosin  (HYTRIN ) 5 MG capsule Take 5 mg by mouth daily.   traZODone (DESYREL) 150 MG tablet Take 150 mg by mouth at bedtime.   Vitamin D, Ergocalciferol,  (DRISDOL) 1.25 MG (50000 UT) CAPS capsule Take 50,000 Units by mouth once a week. Monday or Tuesday      EKGs/Labs/Other Studies Reviewed:    The following studies were reviewed today:  Cardiac Studies & Procedures     STRESS TESTS  MYOCARDIAL PERFUSION IMAGING 03/27/2023  ECHOCARDIOGRAM  ECHOCARDIOGRAM COMPLETE 04/03/2023  Narrative ECHOCARDIOGRAM REPORT    Patient Name:   David Villegas Date of Exam: 04/03/2023 Medical Rec #:  [id number]        Height:       65.6 in Accession #:    [id number]       Weight:       186.2 lb Date of Birth:  20-Feb-1946        BSA:          1.932 m Patient Age:    77 years         BP:           116/71 mmHg Patient Gender: M                HR:           50 bpm. Exam Location:  [place]  Procedure: 2D Echo, Cardiac Doppler, Color Doppler and Strain Analysis  Indications:    PVCs (premature ventricular contractions) [I49.3 (ICD-10-CM)]; Abnormal exercise myocardial perfusion study [R94.39 (ICD-10-CM)]; Essential hypertension [I10 (ICD-10-CM)]; Hyperlipidemia, unspecified hyperlipidemia type [E78.5 (ICD-10-CM)]  History:        Patient has no prior history of Echocardiogram examinations. Arrythmias:PVC; Risk Factors:Hypertension and Dyslipidemia.  Sonographer:    David Villegas RDCS Referring Phys: [id number] David Villegas David Villegas  IMPRESSIONS   1. Left ventricular ejection fraction, by estimation, is 60 to 65%. The left ventricle has normal function. The left ventricle has no regional wall motion abnormalities. There is mild left ventricular hypertrophy. Left ventricular diastolic parameters are consistent with Grade II diastolic dysfunction (pseudonormalization). The average left ventricular global longitudinal strain is -13.9 %. The global longitudinal strain is abnormal. 2. Right ventricular systolic function is normal. The right ventricular size is normal. There is normal pulmonary artery systolic pressure. 3. The mitral valve is normal in  structure. Mild mitral valve regurgitation. No evidence of mitral stenosis. 4. The aortic valve is calcified. There is mild calcification of the aortic valve. There is mild thickening of the aortic valve. Aortic valve regurgitation is mild. Aortic valve sclerosis/calcification is present, without any evidence of aortic stenosis. 5. The inferior vena cava is normal in size with greater than 50% respiratory variability, suggesting right atrial pressure of 3 mmHg.  FINDINGS Left Ventricle: Left ventricular ejection fraction, by estimation, is 60 to 65%. The left ventricle has normal function. The left ventricle has no regional wall motion abnormalities. The average left ventricular global longitudinal strain is -13.9 %. The global longitudinal strain is abnormal. The left ventricular internal cavity size was normal in size.  There is mild left ventricular hypertrophy. Left ventricular diastolic parameters are consistent with Grade II diastolic dysfunction (pseudonormalization).  Right Ventricle: The right ventricular size is normal. No increase in right ventricular wall thickness. Right ventricular systolic function is normal. There is normal pulmonary artery systolic pressure. The tricuspid regurgitant velocity is 1.68 m/s, and with an assumed right atrial pressure of 3 mmHg, the estimated right ventricular systolic pressure is 14.3 mmHg.  Left Atrium: Left atrial size was normal in size.  Right Atrium: Right atrial size was normal in size.  Pericardium: There is no evidence of pericardial effusion.  Mitral Valve: The mitral valve is normal in structure. Mild mitral valve regurgitation. No evidence of mitral valve stenosis.  Tricuspid Valve: The tricuspid valve is normal in structure. Tricuspid valve regurgitation is not demonstrated. No evidence of tricuspid stenosis.  Aortic Valve: The aortic valve is calcified. There is mild calcification of the aortic valve. There is mild thickening of the  aortic valve. Aortic valve regurgitation is mild. Aortic regurgitation PHT measures 714 msec. Aortic valve sclerosis/calcification is present, without any evidence of aortic stenosis.  Pulmonic Valve: The pulmonic valve was normal in structure. Pulmonic valve regurgitation is not visualized. No evidence of pulmonic stenosis.  Aorta: The aortic root is normal in size and structure.  Venous: The inferior vena cava is normal in size with greater than 50% respiratory variability, suggesting right atrial pressure of 3 mmHg.  IAS/Shunts: No atrial level shunt detected by color flow Doppler.   LEFT VENTRICLE PLAX 2D LVIDd:         4.75 cm   Diastology LVIDs:         3.50 cm   LV e' medial:    5.33 cm/s LV PW:         1.30 cm   LV E/e' medial:  15.2 LV IVS:        1.20 cm   LV e' lateral:   5.98 cm/s LVOT diam:     2.00 cm   LV E/e' lateral: 13.5 LV SV:         67 LV SV Index:   35        2D Longitudinal Strain LVOT Area:     3.14 cm  2D Strain GLS Avg:     -13.9 %   RIGHT VENTRICLE             IVC RV Basal diam:  3.10 cm     IVC diam: 2.00 cm RV Mid diam:    2.40 cm RV S prime:     12.60 cm/s TAPSE (M-mode): 2.3 cm  LEFT ATRIUM             Index        RIGHT ATRIUM           Index LA diam:        4.70 cm 2.43 cm/m   RA Area:     13.20 cm LA Vol (A2C):   55.0 ml 28.47 ml/m  RA Volume:   25.30 ml  13.10 ml/m LA Vol (A4C):   41.9 ml 21.69 ml/m LA Biplane Vol: 49.4 ml 25.58 ml/m AORTIC VALVE             PULMONIC VALVE LVOT Vmax:   77.40 cm/s  PR End Diast Vel: 2.46 msec LVOT Vmean:  49.867 cm/s LVOT VTI:    0.213 m AI PHT:      714 msec  AORTA Ao Root diam: 3.20 cm Ao  Asc diam:  3.50 cm Ao Desc diam: 1.90 cm  MITRAL VALVE                  TRICUSPID VALVE MV Area (PHT): 3.50 cm       TR Peak grad:   11.3 mmHg MV Decel Time: 217 msec       TR Vmax:        168.00 cm/s MR Peak grad:    46.5 mmHg MR Vmax:         341.00 cm/s  SHUNTS MR PISA:         1.01 cm     Systemic  VTI:  0.21 m MR PISA Eff ROA: 11 mm       Systemic Diam: 2.00 cm MR PISA Radius:  0.40 cm MV E velocity: 80.95 cm/s MV A velocity: 93.55 cm/s MV E/A ratio:  0.87  Lamar Fitch MD Electronically signed by Lamar Fitch MD Signature Date/Time: 04/03/2023/2:47:36 PM    Final   MONITORS  LONG TERM MONITOR (3-14 DAYS) 06/30/2023  Narrative Patch Wear Time:  6 days and 22 hours (2024-10-03T09:40:31-0400 to 2024-10-10T08:27:41-0400)  Patient had a min HR of 47 bpm, max HR of 174 bpm, and avg HR of 72 bpm. Predominant underlying rhythm was Sinus Rhythm. First Degree AV Block was present.  There were 2 triggered and 1 diary event all sinus rhythm 1 with frequent PVCs.  Sinus pauses of 3 seconds or greater and no episodes of second or third-degree AV nodal block.  There were no episodes of atrial fibrillation or flutter.  1 run of PVCs occurred lasting 5 beats with a max rate of 156 bpm (avg 127 bpm).  6 Supraventricular Tachycardia runs occurred, the run with the fastest interval lasting 13 beats with a max rate of 174 bpm, the longest lasting 13 beats with an avg rate of 127 bpm. .  Isolated SVEs were rare (<1.0%), SVE Couplets were rare (<1.0%), and SVE Triplets were rare (<1.0%).  Isolated VEs were occasional (3.8%, 27108), VE Couplets were rare (<1.0%, 508), and VE Triplets were rare (<1.0%, 32). Ventricular Bigeminy and Trigeminy were present.  CT SCANS  CT CORONARY MORPH W/CTA COR W/SCORE 04/03/2023  Addendum 04/03/2023  3:31 PM ADDENDUM REPORT: 04/03/2023 15:28  EXAM: OVER-READ INTERPRETATION  CT CHEST  The following report is an over-read performed by radiologist Dr. Lonni Corner [facility] on 04/03/2023. This over-read does not include interpretation of cardiac or coronary anatomy or pathology. The coronary CTA interpretation by the cardiologist is attached.  COMPARISON:  CT angio chest 08/20/2019  FINDINGS: No significant mediastinal,  hilar or axillary adenopathy is present. Peripheral ground-glass and interstitial changes are noted in both lungs. No nodule or mass lesion is present. The airways are patent. No significant pleural or pericardial effusion is present.  Vertebral body heights and alignment are normal. Visualized ribs are within normal limits. The visualized sternum is unremarkable.  Visualized upper abdomen is within normal limits.  IMPRESSION: 1. Peripheral ground-glass and interstitial changes in both lungs compatible with interstitial lung disease. 2. No other significant extracardiac findings.   Electronically Signed By: Lonni Necessary M.D. On: 04/03/2023 15:28  Narrative EXAM: Cardiac/Coronary CT  TECHNIQUE: The patient was scanned on a [employer].  PROTOCOL: A 120 kV prospective scan was triggered in the descending thoracic aorta at 111 HU's. Axial non-contrast 3 mm slices were carried out through the heart. The data set was analyzed on a dedicated work station and  scored using the Agatston method. Gantry rotation speed was 250 msecs and collimation was 0.6 mm. Heart rate was optimized medically and sl NTG was given. The 3D data set was reconstructed in 5% intervals of the 35-75 % of the R-R cycle. Systolic and diastolic phases were analyzed on a dedicated work station using MPR, MIP and VRT modes. The patient received 100mL OMNIPAQUE  IOHEXOL  350 MG/ML SOLN of contrast.  FINDINGS: Coronary calcium  score: The patient's coronary artery calcium  score is 1059, which places the patient in the 76th percentile.  Coronary arteries: Normal coronary origins.  Right dominance.  Right Coronary Artery: Normal caliber vessel, gives rise to PDA. Diffuse mixed calcified and noncalcified plaque throughout proximal and mid vessel. Maximum 25-49% stenosis in proximal vessel and 50-69% stenosis in mid vessel.  Left Main Coronary Artery: Normal caliber vessel, short  course. Scattered mixed calcified and noncalcified plaque with 1-24% stenosis.  Left Anterior Descending Coronary Artery: Normal caliber vessel. Diffuse mixed calcified and noncalcified plaque throughout proximal and mid vessel. Maximum 25-49% stenosis in proximal vessel. Gives rise to three small diagonal branches with scattered mixed plaque.  Left Circumflex Artery: Normal caliber vessel. Diffuse mixed calcified and noncalcified plaque throughout proximal and mid vessel. Maximum 25-49% stenosis in mid vessel. Gives rise to medium first and large second OM branches with scattered mixed plaque.  Aorta: Normal size, 33 mm at the mid ascending aorta (level of the PA bifurcation) measured double oblique. Aortic atherosclerosis. No dissection seen in visualized portions of the aorta.  Aortic Valve: Mild calcifications. Trileaflet.  Other findings:  Normal pulmonary vein drainage into the left atrium.  Normal left atrial appendage without a thrombus.  Normal size of the pulmonary artery.  Normal appearance of the pericardium.  Small PFO with left to right shunt.  IMPRESSION: 1. Moderate stenosis, CADRADS = 3. CT FFR will be performed and reported separately. There is diffuse plaque throughout all three vessel beds with scattered mild stenosis, and there is a 50-69% stenosis in the mid RCA.  2. Coronary calcium  score of 1059. This was 76th percentile for age-, sex-, and race- matched controls.  3. Total plaque volume 832 mm3 which is 58th percentile for age- and sex- matched controls (calcified plaque 175 mm3; noncalcified plaque 657 mm3). Total plaque volume is extensive.  4. Normal coronary origin with right dominance.  5.  Aortic atherosclerosis.  6.  Small PFO with left to right shunt.  INTERPRETATION:  CAD-RADS 3: Moderate stenosis (50-69%). Consider symptom-guided anti-ischemic pharmacotherapy as well as risk factor modification per guideline directed care.  Additional analysis with CT FFR will be submitted.  Electronically Signed: By: Shelda Bruckner M.D. On: 04/03/2023 15:10  CARDIAC MRI  MR CARDIAC MORPHOLOGY W WO CONTRAST 08/15/2023  Narrative CLINICAL DATA:  Frequent PVC;s/ CAD  EXAM: CARDIAC MRI  TECHNIQUE: The patient was scanned on a 1.5 Tesla Siemens magnet. A dedicated cardiac coil was used. Functional imaging was done using Fiesta sequences. 2,3, and 4 chamber views were done to assess for RWMA's. Modified Simpson's rule using a short axis stack was used to calculate an ejection fraction on a dedicated work [employer]. The patient received 10 cc of Gadavist . After 10 minutes inversion recovery sequences were used to assess for infiltration and scar tissue.  CONTRAST:  Gadavist   FINDINGS: Mild LAE. No LAA thrombus. Normal RA/RV. Mild thickening of MV with mild MR. Tri leaflet AV with mild AR. Normal ascending thoracic aorta 3.4 cm. Mobile atrial septum with no  PFO/ASD. No pericardial effusion. Normal LV size and function mild LVH septal thickness 12 mm. Normal RV size and function.  Quantitative LVEF 54% (EDV 160 cc ESV 73 cc SV 87 cc) Estimated cardiac output 4.4 L/min  Quantitative RVEF 50% (EDV 165 cc ESV 82 cc SV 83 cc )  Delayed enhancement images with no scar, infarct or infiltration  Parametric measures: Using Hct 39  T1 normal 1015 msec  ECV normal 26%  T2 normal 46.2 msec  IMPRESSION: 1.  Mild LAE no LAA thrombus  2.  Mild LVH septal thickness 12 mm Normal LV size/function LVEF 54%  3. No gadolinium enhancement on delayed inversion recovery sequences  4.  Normal RV size / function RVEF 50%  5.  Mild appearing AR/MR  6.  Estimated cardiac output 4.4 L/min  7.  Normal parametric measures see values above  Maude Emmer   Electronically Signed By: Maude Emmer M.D. On: 08/15/2023 11:12             Recent Labs: No results found for requested labs within  last 365 days.  Recent Lipid Panel No results found for: CHOL, TRIG, HDL, CHOLHDL, VLDL, LDLCALC, LDLDIRECT  Physical Exam:    VS:  BP (!) 146/78   Pulse 63   Ht 5' 5 (1.651 m)   Wt 184 lb 6.4 oz (83.6 kg)   SpO2 97%   BMI 30.69 kg/m     Wt Readings from Last 3 Encounters:  09/12/23 184 lb 6.4 oz (83.6 kg)  06/12/23 179 lb 12.8 oz (81.6 kg)  03/31/23 186 lb 3.2 oz (84.5 kg)     GEN:  Well nourished, well developed in no acute distress HEENT: Normal NECK: No JVD; No carotid bruits LYMPHATICS: No lymphadenopathy CARDIAC: RRR, no murmurs, rubs, gallops RESPIRATORY:  Clear to auscultation without rales, wheezing or rhonchi  ABDOMEN: Soft, non-tender, non-distended MUSCULOSKELETAL:  No edema; No deformity  SKIN: Warm and dry NEUROLOGIC:  Alert and oriented x 3 PSYCHIATRIC:  Normal affect    Signed, David Leiter, MD  09/12/2023 9:48 AM    [facility]

## 2023-09-12 ENCOUNTER — Encounter: Payer: Self-pay | Admitting: Cardiology

## 2023-09-12 ENCOUNTER — Ambulatory Visit: Payer: Self-pay | Attending: Cardiology | Admitting: Cardiology

## 2023-09-12 VITALS — BP 146/78 | HR 63 | Ht 65.0 in | Wt 184.4 lb

## 2023-09-12 DIAGNOSIS — I10 Essential (primary) hypertension: Secondary | ICD-10-CM

## 2023-09-12 DIAGNOSIS — E782 Mixed hyperlipidemia: Secondary | ICD-10-CM

## 2023-09-12 DIAGNOSIS — R931 Abnormal findings on diagnostic imaging of heart and coronary circulation: Secondary | ICD-10-CM

## 2023-09-12 DIAGNOSIS — I493 Ventricular premature depolarization: Secondary | ICD-10-CM | POA: Diagnosis not present

## 2023-09-12 DIAGNOSIS — I25118 Atherosclerotic heart disease of native coronary artery with other forms of angina pectoris: Secondary | ICD-10-CM | POA: Diagnosis not present

## 2023-09-12 MED ORDER — METOPROLOL SUCCINATE ER 100 MG PO TB24: 100.0000 mg | ORAL_TABLET | Freq: Every day | ORAL | 3 refills | Status: AC

## 2023-09-12 MED ORDER — ASPIRIN 81 MG PO TBEC
81.0000 mg | DELAYED_RELEASE_TABLET | Freq: Every day | ORAL | Status: AC
Start: 2023-09-12 — End: ?

## 2023-09-12 NOTE — Patient Instructions (Signed)

## 2023-09-22 DIAGNOSIS — K080 Exfoliation of teeth due to systemic causes: Secondary | ICD-10-CM | POA: Diagnosis not present

## 2024-04-01 ENCOUNTER — Ambulatory Visit

## 2024-04-01 VITALS — BP 130/72 | HR 55 | Ht 65.5 in | Wt 186.0 lb

## 2024-04-01 DIAGNOSIS — E782 Mixed hyperlipidemia: Secondary | ICD-10-CM

## 2024-04-01 DIAGNOSIS — I10 Essential (primary) hypertension: Secondary | ICD-10-CM | POA: Diagnosis not present

## 2024-04-01 DIAGNOSIS — I493 Ventricular premature depolarization: Secondary | ICD-10-CM

## 2024-04-01 NOTE — Assessment & Plan Note (Signed)
 Controlled. Continue amlodipine  2.5 mg once daily Continue metoprolol  XL 100 mg once daily.  Next

## 2024-04-01 NOTE — Patient Instructions (Signed)
 Medication Instructions:  Your physician recommends that you continue on your current medications as directed. Please refer to the Current Medication list given to you today.  *If you need a refill on your cardiac medications before your next appointment, please call your pharmacy*   Lab Work: None Ordered If you have labs (blood work) drawn today and your tests are completely normal, you will receive your results only by: MyChart Message (if you have MyChart) OR A paper copy in the mail If you have any lab test that is abnormal or we need to change your treatment, we will call you to review the results.   Testing/Procedures: None Ordered   Follow-Up: At [facility], you and your health needs are our priority.  As part of our continuing mission to provide you with exceptional heart care, we have created designated Provider Care Teams.  These Care Teams include your primary Cardiologist (physician) and Advanced Practice Providers (APPs -  Physician Assistants and Nurse Practitioners) who all work together to provide you with the care you need, when you need it.  We recommend signing up for the patient portal called "MyChart".  Sign up information is provided on this After Visit Summary.  MyChart is used to connect with patients for Virtual Visits (Telemedicine).  Patients are able to view lab/test results, encounter notes, upcoming appointments, etc.  Non-urgent messages can be sent to your provider as well.   To learn more about what you can do with MyChart, go to [email or web address].    Your next appointment:   12 month(s)  The format for your next appointment:   In Person  Provider:   Huntley Dec, MD    Other Instructions NA

## 2024-04-01 NOTE — Assessment & Plan Note (Signed)
 Symptomatic PVCs. Occasional burden 3.8% on Zio patch October 2024. Well-controlled on current dose of metoprolol  succinate 100 mg once daily.

## 2024-04-01 NOTE — Assessment & Plan Note (Signed)
 Last blood work from 06/04/2023 total cholesterol 124, triglycerides 79, HDL 53 and LDL 55, well-controlled. He mentions that his recent repeat panel that also notes good blood cholesterol control.  Continue rosuvastatin  10 mg once daily.

## 2024-04-01 NOTE — Progress Notes (Signed)
 Cardiology Consultation:    Date:  04/01/2024   ID:  David Villegas, DOB Jul 04, 1946, MRN [id number]  PCP:  Orlando Caron DASEN, MD  Cardiologist:  Alean SAUNDERS Mayzie Caughlin, MD   Referring MD: Orlando Caron DASEN, MD   Chief Complaint  Patient presents with   Follow-up     ASSESSMENT AND PLAN:   Dr. Pulley 78 year old male with history of CAD cardiac CT 04/03/2023 with moderate stenosis of mid RCA not significant by CT FFR, elevated calcium  score 1059, echocardiogram 04/03/2023 with normal biventricular function EF 60 to 65%, grade 2 diastolic dysfunction, cardiac MRI 09/04/2023 with normal biventricular function and no evidence of LGE, mild MR, mild aortic insufficiency, occasional PVC symptomatic with burden 3.8% on Zio patch October 2024, hypertension, hypothyroidism, hyperlipidemia.  Here for routine follow-up visit. Doing well.  Problem List Items Addressed This Visit     HLD (hyperlipidemia)   Last blood work from 06/04/2023 total cholesterol 124, triglycerides 79, HDL 53 and LDL 55, well-controlled. He mentions that his recent repeat panel that also notes good blood cholesterol control.  Continue rosuvastatin  10 mg once daily.       Benign essential hypertension - Primary   Controlled. Continue amlodipine  2.5 mg once daily Continue metoprolol  XL 100 mg once daily.  Next      PVC (premature ventricular contraction)   Symptomatic PVCs. Occasional burden 3.8% on Zio patch October 2024. Well-controlled on current dose of metoprolol  succinate 100 mg once daily.        Return to clinic tentatively in 1 year or as needed.  Next   History of Present Illness:    David Villegas is a 78 y.o. male who is being seen today for follow-up visit. PCP is Orlando Caron DASEN, MD. Last visit with us  in the office was 09-12-2023 with Dr. Monetta.  Is a retired Careers adviser, lives with his wife who is a Public relations account executive family physician locally.  Here for the visit by himself.  Has history  of CAD with cardiac CT 04/03/2023 noting CAD RADS 3 study moderate stenosis of mid RCA, not significant by CT FFR, calcium  score 1059.  Echocardiogram from 04/03/2023 noted normal biventricular function EF 60 to 65%, grade 2 diastolic dysfunction, mild MR, mild aortic insufficiency, and cardiac MRI 08/15/2023 completed noted mild LVH with EF 54%, normal RV function, mild MR and mild aortic insufficiency, no evidence of LGE. Heart monitor from October 2024 reported ventricular ectopy burden 3.8%, otherwise normal sinus rhythm with average heart rate 72/min, rare supraventricular ectopy less than 1% and 1 short run of PVCs lasting 5 beats.. Also has history of hypothyroidism, hypertension, hyperlipidemia.  Reports no active symptoms going on at this time. Keeps himself busy with day-to-day activities.  Mentions that his weakness tends to be eating too many sweets.  Mentions he has had blood work recently where his thyroid  panel has been reportedly normal along with normal blood work for cholesterol panel.  Denies any significant palpitations.  Able to track his heart rate and rhythm using his Apple smart watch and mentions he was able to feel PVCs in the past but has noticed less and less of these episodes.  Does not smoke. Does not smoke or drink alcohol . Drinks tea occasionally.   Blood work from 06/04/2023 notes BUN 13, creatinine 0.8, EGFR 91 Normal transaminases and alkaline phosphatase Total cholesterol 124, triglycerides 79, HDL 53 and LDL 55. TSH was noted to be low 0.052. Hemoglobin 13.3 and hematocrit 39.3. Past Medical History:  Diagnosis Date   Abnormal EKG    Benign essential hypertension    BMI 27.0-27.9,adult    BPH (benign prostatic hyperplasia)    Cervical disc disorder, unspecified, cervicothoracic region    COVID-19    Depression    Dyslipidemia    Essential hypertension 08/17/2019   High cholesterol    HLD (hyperlipidemia) 08/17/2019   Hypertension    Hypogonadism  male    Hypothyroidism    Insomnia    Nervousness    Osteoporosis    Other chest pain    Palpitations    Pneumonia due to COVID-19 virus 08/17/2019   Pulmonary embolism (HCC)    PVC (premature ventricular contraction)    Vertigo    Vitamin D deficiency    Xiphodynia     Past Surgical History:  Procedure Laterality Date   MASTOIDECTOMY Left    NASAL SEPTUM SURGERY     TONSILECTOMY, ADENOIDECTOMY, BILATERAL MYRINGOTOMY AND TUBES      Current Medications: Current Meds  Medication Sig   amLODipine  (NORVASC ) 2.5 MG tablet Take 2.5 mg by mouth daily.   aspirin  EC 81 MG tablet Take 1 tablet (81 mg total) by mouth daily. Swallow whole.   finasteride  (PROSCAR ) 5 MG tablet Take 5 mg by mouth daily.   griseofulvin (GRIS-PEG) 250 MG tablet Take 250 mg by mouth daily.   levothyroxine  (SYNTHROID ) 125 MCG tablet Take 125 mcg by mouth daily.   metoprolol  succinate (TOPROL -XL) 100 MG 24 hr tablet Take 1 tablet (100 mg total) by mouth daily. Take with or immediately following a meal.   nitroGLYCERIN  (NITROSTAT ) 0.4 MG SL tablet Place 0.4 mg under the tongue every 5 (five) minutes as needed for chest pain.   omeprazole (PRILOSEC) 40 MG capsule Take 40 mg by mouth as needed (Acid reflux).   rosuvastatin  (CRESTOR ) 10 MG tablet Take 10 mg by mouth at bedtime.   terazosin  (HYTRIN ) 5 MG capsule Take 5 mg by mouth daily.   Vitamin D, Ergocalciferol, (DRISDOL) 1.25 MG (50000 UT) CAPS capsule Take 50,000 Units by mouth once a week. Monday or Tuesday   [DISCONTINUED] traZODone (DESYREL) 150 MG tablet Take 150 mg by mouth at bedtime.     Allergies:   Norvasc  [amlodipine ] and Lisinopril   Social History   Socioeconomic History   Marital status: Unknown    Spouse name: Not on file   Number of children: Not on file   Years of education: Not on file   Highest education level: Not on file  Occupational History   Not on file  Tobacco Use   Smoking status: Never   Smokeless tobacco: Never   Substance and Sexual Activity   Alcohol  use: Never   Drug use: Never   Sexual activity: Not on file  Other Topics Concern   Not on file  Social History Narrative   Not on file   Social Drivers of Health   Financial Resource Strain: Not on file  Food Insecurity: Not on file  Transportation Needs: Not on file  Physical Activity: Not on file  Stress: Not on file  Social Connections: Not on file     Family History: The patient's family history includes Heart attack in his father. ROS:   Please see the history of present illness.    All 14 point review of systems negative except as described per history of present illness.  EKGs/Labs/Other Studies Reviewed:    The following studies were reviewed today:   EKG:  Recent Labs: No results found for requested labs within last 365 days.  Recent Lipid Panel No results found for: CHOL, TRIG, HDL, CHOLHDL, VLDL, LDLCALC, LDLDIRECT  Physical Exam:    VS:  BP 130/72 (BP Location: Left Arm, Patient Position: Sitting)   Pulse (!) 55   Ht 5' 5.5 (1.664 m)   Wt 186 lb (84.4 kg)   SpO2 97%   BMI 30.48 kg/m     Wt Readings from Last 3 Encounters:  04/01/24 186 lb (84.4 kg)  09/12/23 184 lb 6.4 oz (83.6 kg)  06/12/23 179 lb 12.8 oz (81.6 kg)     GENERAL:  Well nourished, well developed in no acute distress NECK: No JVD; No carotid bruits CARDIAC: RRR, S1 and S2 present, 3/6 systolic murmur best heard over the precordial area CHEST:  Clear to auscultation without rales, wheezing or rhonchi  Extremities: No pitting pedal edema. Pulses bilaterally symmetric with radial 2+ and dorsalis pedis 2+ NEUROLOGIC:  Alert and oriented x 3  Medication Adjustments/Labs and Tests Ordered: Current medicines are reviewed at length with the patient today.  Concerns regarding medicines are outlined above.  No orders of the defined types were placed in this encounter.  No orders of the defined types were placed in this  encounter.   Signed, Alean jess Kobus, MD, MPH, [facility] 04/01/2024 9:58 AM    [facility]

## 2024-04-06 ENCOUNTER — Ambulatory Visit: Admitting: Cardiology
